# Patient Record
Sex: Female | Born: 1965
Health system: Southern US, Academic
[De-identification: ages and names within clinical notes are randomized; demographics above are authoritative.]

## PROBLEM LIST (undated history)

## (undated) ENCOUNTER — Ambulatory Visit

## (undated) ENCOUNTER — Telehealth

## (undated) ENCOUNTER — Encounter

## (undated) ENCOUNTER — Ambulatory Visit: Payer: TRICARE (CHAMPUS)

## (undated) ENCOUNTER — Encounter: Attending: Gastroenterology | Primary: Gastroenterology

## (undated) ENCOUNTER — Ambulatory Visit: Attending: Pharmacist | Primary: Pharmacist

## (undated) DIAGNOSIS — E079 Disorder of thyroid, unspecified: Secondary | ICD-10-CM

## (undated) DIAGNOSIS — Z944 Liver transplant status: Secondary | ICD-10-CM

## (undated) DIAGNOSIS — M8580 Other specified disorders of bone density and structure, unspecified site: Secondary | ICD-10-CM

## (undated) DIAGNOSIS — Z9889 Other specified postprocedural states: Secondary | ICD-10-CM

## (undated) DIAGNOSIS — T859XXA Unspecified complication of internal prosthetic device, implant and graft, initial encounter: Secondary | ICD-10-CM

## (undated) DIAGNOSIS — E8801 Alpha-1-antitrypsin deficiency: Secondary | ICD-10-CM

## (undated) DIAGNOSIS — J45909 Unspecified asthma, uncomplicated: Secondary | ICD-10-CM

## (undated) DIAGNOSIS — K219 Gastro-esophageal reflux disease without esophagitis: Secondary | ICD-10-CM

## (undated) HISTORY — DX: Unspecified complication of internal prosthetic device, implant and graft, initial encounter: T85.9XXA

## (undated) HISTORY — DX: Other specified disorders of bone density and structure, unspecified site: M85.80

## (undated) HISTORY — DX: Unspecified asthma, uncomplicated: J45.909

## (undated) HISTORY — PX: FRACTURE SURGERY: SHX138

## (undated) HISTORY — DX: Gastro-esophageal reflux disease without esophagitis: K21.9

---
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
## 2012-09-09 ENCOUNTER — Emergency Department: Payer: Self-pay | Admitting: Emergency Medicine

## 2012-09-09 LAB — URINALYSIS, COMPLETE
Bacteria: NONE SEEN
Bilirubin,UR: NEGATIVE
Ph: 5 (ref 4.5–8.0)
RBC,UR: 125 /HPF (ref 0–5)
WBC UR: 8 /HPF (ref 0–5)

## 2012-09-09 LAB — COMPREHENSIVE METABOLIC PANEL
Albumin: 2.1 g/dL — ABNORMAL LOW (ref 3.4–5.0)
Anion Gap: 8 (ref 7–16)
Bilirubin,Total: 3.5 mg/dL — ABNORMAL HIGH (ref 0.2–1.0)
Calcium, Total: 7.5 mg/dL — ABNORMAL LOW (ref 8.5–10.1)
SGOT(AST): 75 U/L — ABNORMAL HIGH (ref 15–37)
Sodium: 137 mmol/L (ref 136–145)
Total Protein: 7.2 g/dL (ref 6.4–8.2)

## 2012-09-09 LAB — CK TOTAL AND CKMB (NOT AT ARMC): CK-MB: 2.2 ng/mL (ref 0.5–3.6)

## 2012-09-09 LAB — CBC
HCT: 36 % (ref 35.0–47.0)
HGB: 11.6 g/dL — ABNORMAL LOW (ref 12.0–16.0)
MCHC: 32.3 g/dL (ref 32.0–36.0)
MCV: 103 fL — ABNORMAL HIGH (ref 80–100)
Platelet: 67 10*3/uL — ABNORMAL LOW (ref 150–440)
RBC: 3.49 10*6/uL — ABNORMAL LOW (ref 3.80–5.20)
RDW: 16.7 % — ABNORMAL HIGH (ref 11.5–14.5)
WBC: 5 10*3/uL (ref 3.6–11.0)

## 2012-09-09 LAB — TROPONIN I: Troponin-I: <0.02 ng/mL

## 2012-09-09 LAB — LIPASE, BLOOD: Lipase: 93 U/L (ref 73–393)

## 2013-07-10 ENCOUNTER — Ambulatory Visit: Payer: Self-pay | Admitting: Gastroenterology

## 2013-07-15 ENCOUNTER — Ambulatory Visit: Payer: Self-pay | Admitting: Gastroenterology

## 2013-09-15 ENCOUNTER — Ambulatory Visit: Payer: Self-pay | Admitting: Internal Medicine

## 2014-01-08 ENCOUNTER — Ambulatory Visit: Payer: Self-pay | Admitting: Gastroenterology

## 2014-01-30 HISTORY — PX: LIVER TRANSPLANT: SHX410

## 2014-01-30 HISTORY — PX: CHOLECYSTECTOMY: SHX55

## 2014-09-26 ENCOUNTER — Emergency Department (HOSPITAL_COMMUNITY)
Admission: EM | Admit: 2014-09-26 | Discharge: 2014-09-26 | Disposition: A | Discharge status: Other Acute Inpatient Hospital | Attending: Emergency Medicine | Admitting: Emergency Medicine

## 2014-09-26 ENCOUNTER — Emergency Department (HOSPITAL_COMMUNITY)

## 2014-09-26 ENCOUNTER — Encounter (HOSPITAL_COMMUNITY): Payer: Self-pay | Admitting: Emergency Medicine

## 2014-09-26 DIAGNOSIS — I959 Hypotension, unspecified: Secondary | ICD-10-CM | POA: Insufficient documentation

## 2014-09-26 DIAGNOSIS — A419 Sepsis, unspecified organism: Secondary | ICD-10-CM | POA: Insufficient documentation

## 2014-09-26 DIAGNOSIS — R1010 Upper abdominal pain, unspecified: Secondary | ICD-10-CM | POA: Diagnosis present

## 2014-09-26 DIAGNOSIS — Z8639 Personal history of other endocrine, nutritional and metabolic disease: Secondary | ICD-10-CM | POA: Diagnosis not present

## 2014-09-26 DIAGNOSIS — K830 Cholangitis: Secondary | ICD-10-CM | POA: Insufficient documentation

## 2014-09-26 DIAGNOSIS — R0602 Shortness of breath: Secondary | ICD-10-CM | POA: Diagnosis not present

## 2014-09-26 DIAGNOSIS — R000 Tachycardia, unspecified: Secondary | ICD-10-CM | POA: Diagnosis not present

## 2014-09-26 DIAGNOSIS — R6521 Severe sepsis with septic shock: Secondary | ICD-10-CM | POA: Diagnosis not present

## 2014-09-26 DIAGNOSIS — K8309 Other cholangitis: Secondary | ICD-10-CM

## 2014-09-26 HISTORY — DX: Alpha-1-antitrypsin deficiency: E88.01

## 2014-09-26 HISTORY — DX: Other specified postprocedural states: Z98.890

## 2014-09-26 HISTORY — DX: Liver transplant status: Z94.4

## 2014-09-26 HISTORY — DX: Disorder of thyroid, unspecified: E07.9

## 2014-09-26 LAB — LIPASE, BLOOD: Lipase: 18 U/L (ref 11–59)

## 2014-09-26 LAB — CBC WITH DIFFERENTIAL/PLATELET
BASOS ABS: 0 10*3/uL (ref 0.0–0.1)
Basophils Relative: 0 % (ref 0–1)
Eosinophils Absolute: 0 10*3/uL (ref 0.0–0.7)
Eosinophils Relative: 0 % (ref 0–5)
HCT: 24.9 % — ABNORMAL LOW (ref 36.0–46.0)
HEMOGLOBIN: 7.9 g/dL — AB (ref 12.0–15.0)
LYMPHS PCT: 2 % — AB (ref 12–46)
Lymphs Abs: 0.5 10*3/uL — ABNORMAL LOW (ref 0.7–4.0)
MCH: 28.1 pg (ref 26.0–34.0)
MCHC: 31.7 g/dL (ref 30.0–36.0)
MCV: 88.6 fL (ref 78.0–100.0)
Monocytes Absolute: 1.2 10*3/uL — ABNORMAL HIGH (ref 0.1–1.0)
Monocytes Relative: 5 % (ref 3–12)
Neutro Abs: 21.9 10*3/uL — ABNORMAL HIGH (ref 1.7–7.7)
Neutrophils Relative %: 93 % — ABNORMAL HIGH (ref 43–77)
Platelets: 223 10*3/uL (ref 150–400)
RBC: 2.81 MIL/uL — ABNORMAL LOW (ref 3.87–5.11)
RDW: 14.1 % (ref 11.5–15.5)
WBC Morphology: INCREASED
WBC: 23.6 10*3/uL — ABNORMAL HIGH (ref 4.0–10.5)

## 2014-09-26 LAB — COMPREHENSIVE METABOLIC PANEL
ALT: 246 U/L — ABNORMAL HIGH (ref 0–35)
AST: 225 U/L — ABNORMAL HIGH (ref 0–37)
Albumin: 2.2 g/dL — ABNORMAL LOW (ref 3.5–5.2)
Alkaline Phosphatase: 233 U/L — ABNORMAL HIGH (ref 39–117)
Anion gap: 15 (ref 5–15)
BILIRUBIN TOTAL: 10.4 mg/dL — AB (ref 0.3–1.2)
BUN: 53 mg/dL — ABNORMAL HIGH (ref 6–23)
CHLORIDE: 100 mmol/L (ref 96–112)
CO2: 18 mmol/L — ABNORMAL LOW (ref 19–32)
Calcium: 7.7 mg/dL — ABNORMAL LOW (ref 8.4–10.5)
Creatinine, Ser: 2.06 mg/dL — ABNORMAL HIGH (ref 0.50–1.10)
GFR calc non Af Amer: 27 mL/min — ABNORMAL LOW (ref >90)
GFR, EST AFRICAN AMERICAN: 31 mL/min — AB (ref >90)
GLUCOSE: 118 mg/dL — AB (ref 70–99)
Potassium: 3.1 mmol/L — ABNORMAL LOW (ref 3.5–5.1)
Sodium: 133 mmol/L — ABNORMAL LOW (ref 135–145)
TOTAL PROTEIN: 6 g/dL (ref 6.0–8.3)

## 2014-09-26 LAB — I-STAT CG4 LACTIC ACID, ED: LACTIC ACID, VENOUS: 6.26 mmol/L — AB (ref 0.5–2.0)

## 2014-09-26 LAB — BLOOD GAS, ARTERIAL
Acid-base deficit: 10.5 mmol/L — ABNORMAL HIGH (ref 0.0–2.0)
Bicarbonate: 13.9 mEq/L — ABNORMAL LOW (ref 20.0–24.0)
Drawn by: 422461
O2 Content: 4 L/min
O2 Saturation: 97 %
PATIENT TEMPERATURE: 100
PO2 ART: 95.6 mmHg (ref 80.0–100.0)
TCO2: 13.6 mmol/L (ref 0–100)
pCO2 arterial: 26.9 mmHg — ABNORMAL LOW (ref 35.0–45.0)
pH, Arterial: 7.338 — ABNORMAL LOW (ref 7.350–7.450)

## 2014-09-26 LAB — AMMONIA: Ammonia: 17 umol/L (ref 11–32)

## 2014-09-26 LAB — TROPONIN I: Troponin I: 0.05 ng/mL — ABNORMAL HIGH (ref <0.031)

## 2014-09-26 MED ORDER — NOREPINEPHRINE BITARTRATE 1 MG/ML IV SOLN
0.0000 ug/min | Freq: Once | INTRAVENOUS | Status: DC
  Filled 2014-09-26: qty 4

## 2014-09-26 MED ORDER — FENTANYL CITRATE 0.05 MG/ML IJ SOLN: 50.0000 ug | Freq: Once | INTRAMUSCULAR | Status: DC

## 2014-09-26 MED ORDER — SODIUM CHLORIDE 0.9 % IV BOLUS (SEPSIS)
1000.0000 mL | INTRAVENOUS | Status: AC
  Administered 2014-09-26 (×2): 1000 mL via INTRAVENOUS

## 2014-09-26 MED ORDER — VANCOMYCIN HCL IN DEXTROSE 1-5 GM/200ML-% IV SOLN
1000.0000 mg | Freq: Once | INTRAVENOUS | Status: DC
  Filled 2014-09-26: qty 200

## 2014-09-26 MED ORDER — SODIUM CHLORIDE 0.9 % IV BOLUS (SEPSIS)
2000.0000 mL | Freq: Once | INTRAVENOUS | Status: AC
  Administered 2014-09-26: 2000 mL via INTRAVENOUS

## 2014-09-26 MED ORDER — PIPERACILLIN-TAZOBACTAM 3.375 G IVPB
3.3750 g | Freq: Once | INTRAVENOUS | Status: AC
  Administered 2014-09-26: 3.375 g via INTRAVENOUS
  Filled 2014-09-26: qty 50

## 2014-09-26 MED ORDER — SODIUM CHLORIDE 0.9 % IV BOLUS (SEPSIS): 500.0000 mL | INTRAVENOUS | Status: DC

## 2014-09-26 NOTE — ED Notes (Addendum)
Hx of liver transplant May 2015. Fever and N/v for past few days. Hypotensive with EMS and febile. Has been taking tylenol at home. Took 650mg  tylenol at 1500.

## 2014-09-26 NOTE — ED Provider Notes (Signed)
8:25 PM At this time the patient's blood pressure is 83 systolic.  Her map is 93.  She will begin norepinephrine at this time.  She has 2 large-bore IVs and seem to be working great.  At this time I think with starting the norepinephrine I think she is stabilized to the point where she will transport the short distance without any difficulty.  I spoke with by critical care transport team who are comfortable with this.  I think it is in the patient's best interest at this time for urgent transfer to [facility] where she can be under the care of her hepatology team.  She may benefit from ERCP tonight and will deftly need additional imaging.  Heart rate seems to be improving.  Mental status continues to be good.  At this time I do not believe the patient needs to be intubated.  I've offered several line placement but at this time I think it will just delay her care and not change her outcome.  Central line can be placed when she arrives at the [facility].  Lyanne Co, MD 09/26/14 2026

## 2014-09-26 NOTE — ED Notes (Signed)
Bed: XU38 Expected date: 09/26/14 Expected time: 6:23 PM Means of arrival: Ambulance Comments: Fever

## 2014-09-26 NOTE — ED Notes (Signed)
Gave report to [facility] with [facility]. He is requesting for all images to be placed on CD for [facility]. Informed Debbie, MUS of this information.

## 2014-09-26 NOTE — ED Notes (Signed)
Attempted to call report to [facility] ED.

## 2014-09-26 NOTE — ED Provider Notes (Signed)
CSN: [id number]     Arrival date & time 09/26/14  1832 History   First MD Initiated Contact with Patient 09/26/14 1837     Chief Complaint  Patient presents with  . Hypotension  . Fever     (Consider location/radiation/quality/duration/timing/severity/associated sxs/prior Treatment) HPI Margaret Tyler is a 49 year old female status post liver transplant May/2015 at [facility] who presents the ER playing of fever, nausea, vomiting 4 days. Patient reports MAXIMUM TEMPERATURE at home 104F, and has been taking over-the-counter Tylenol. Patient reports diffuse upper abdominal pain which is also been present for the past 3 days. Patient states her symptoms have progressively worsened and today she became very weak and fell in her bathroom. Patient brought into the ER by EMS who noted patient be febrile and hypotensive. Patient denies having any injury after a fall.  Patient reports worsening of her abdominal pain with deep breaths, endorses shortness of breath. Patient denies chest pain, dizziness, weakness, blurred vision, headache, altered mental status.  Past Medical History  Diagnosis Date  . Liver transplanted   . Thyroid disease   . History of ERCP   . Alpha-1-antitrypsin deficiency    History reviewed. No pertinent past surgical history. History reviewed. No pertinent family history. History  Substance Use Topics  . Smoking status: Never Smoker   . Smokeless tobacco: Not on file  . Alcohol Use: No   OB History    No data available     Review of Systems  Constitutional: Positive for fever.  HENT: Negative for trouble swallowing.   Eyes: Negative for visual disturbance.  Respiratory: Positive for shortness of breath.   Cardiovascular: Negative for chest pain.  Gastrointestinal: Positive for nausea, vomiting, abdominal pain and diarrhea.  Genitourinary: Negative for dysuria.  Musculoskeletal: Negative for neck pain.  Skin: Negative for rash.  Neurological: Negative for  dizziness, syncope, speech difficulty, weakness, light-headedness, numbness and headaches.  Psychiatric/Behavioral: Negative.     Allergies  Fosamax and Wasp venom  Home Medications   Prior to Admission medications   Not on File   BP 85/46 mmHg  Pulse 117  Temp(Src) 97.9 F (36.6 C) (Rectal)  Resp 24  Wt 162 lb (73.483 kg)  SpO2 100%  LMP  (LMP Unknown) Physical Exam  Constitutional: She is oriented to person, place, and time. She appears well-developed and well-nourished.  Patient fully alert, with a sickly appearance, in mild to moderate distress from discomfort and shortness of breath. Patient obviously jaundiced.  HENT:  Head: Normocephalic and atraumatic.  Mouth/Throat: Oropharynx is clear and moist. No oropharyngeal exudate.  Eyes: Right eye exhibits no discharge. Left eye exhibits no discharge. No scleral icterus.  Neck: Normal range of motion.  Cardiovascular: Regular rhythm, S1 normal, S2 normal and normal heart sounds.  Tachycardia present.   No murmur heard. Pulses:      Radial pulses are 1+ on the right side, and 1+ on the left side.  Tachycardia at 120 on exam  Pulmonary/Chest: Effort normal and breath sounds normal. No accessory muscle usage. Tachypnea noted.  Abdominal: Soft. Normal appearance and bowel sounds are normal. There is generalized tenderness.  Generalized tenderness more significant in epigastric and right upper quadrant  Musculoskeletal: Normal range of motion. She exhibits no edema or tenderness.  Neurological: She is alert and oriented to person, place, and time. No cranial nerve deficit or sensory deficit. Coordination normal. GCS eye subscore is 4. GCS verbal subscore is 5. GCS motor subscore is 6.  Patient fully alert, answering questions  appropriately in full, clear sentences. Cranial nerves II through XII grossly intact. Motor strength 5 out of 5 in all major muscle groups of upper and lower extremities. Distal sensation intact.  Skin: Skin is  warm and dry. No rash noted. She is not diaphoretic.  Psychiatric: She has a normal mood and affect.  Nursing note and vitals reviewed.   ED Course  Procedures (including critical care time) Labs Review Labs Reviewed  BLOOD GAS, ARTERIAL - Abnormal; Notable for the following:    pH, Arterial 7.338 (*)    pCO2 arterial 26.9 (*)    Bicarbonate 13.9 (*)    Acid-base deficit 10.5 (*)    All other components within normal limits  CBC WITH DIFFERENTIAL/PLATELET - Abnormal; Notable for the following:    WBC 23.6 (*)    RBC 2.81 (*)    Hemoglobin 7.9 (*)    HCT 24.9 (*)    Neutrophils Relative % 93 (*)    Lymphocytes Relative 2 (*)    Neutro Abs 21.9 (*)    Lymphs Abs 0.5 (*)    Monocytes Absolute 1.2 (*)    All other components within normal limits  COMPREHENSIVE METABOLIC PANEL - Abnormal; Notable for the following:    Sodium 133 (*)    Potassium 3.1 (*)    CO2 18 (*)    Glucose, Bld 118 (*)    BUN 53 (*)    Creatinine, Ser 2.06 (*)    Calcium 7.7 (*)    Albumin 2.2 (*)    AST 225 (*)    ALT 246 (*)    Alkaline Phosphatase 233 (*)    Total Bilirubin 10.4 (*)    GFR calc non Af Amer 27 (*)    GFR calc Af Amer 31 (*)    All other components within normal limits  BRAIN NATRIURETIC PEPTIDE - Abnormal; Notable for the following:    B Natriuretic Peptide 175.9 (*)    All other components within normal limits  TROPONIN I - Abnormal; Notable for the following:    Troponin I 0.05 (*)    All other components within normal limits  I-STAT CG4 LACTIC ACID, ED - Abnormal; Notable for the following:    Lactic Acid, Venous 6.26 (*)    All other components within normal limits  CULTURE, BLOOD (ROUTINE X 2)  CULTURE, BLOOD (ROUTINE X 2)  URINE CULTURE  AMMONIA  LIPASE, BLOOD    Imaging Review Dg Chest Port 1 View  09/26/2014   CLINICAL DATA:  Acute onset of hypotension and fever. Mild shortness of breath and confusion. Nausea and vomiting. Initial encounter.  EXAM: PORTABLE CHEST  - 1 VIEW  COMPARISON:  Chest radiograph performed 09/09/2012  FINDINGS: The lungs are hypoexpanded. Mild vascular congestion is noted. Minimal right basilar opacity may reflect atelectasis or scarring. There is no evidence of pleural effusion or pneumothorax.  The cardiomediastinal silhouette is within normal limits. No acute osseous abnormalities are seen.  IMPRESSION: Minimal persistent right basilar opacity may reflect atelectasis or scarring. Lungs hypoexpanded, with mild vascular congestion.   Electronically Signed   By: Roanna Raider M.D.   On: 09/26/2014 19:26     EKG Interpretation None      MDM   Final diagnoses:  Ascending cholangitis  Septic shock   Patient here with obvious jaundice, hypotension, several days of fever status post liver transplant in May 2015. Lactic acid at 6, bicarbonate 13, Began patient on sepsis protocol, placed 2 large-bore IVs, infused 4  L of fluid during stay here. With combination of fever, jaundice, right upper quadrant abdominal pain, likely suspect likely a ascending cholangitis. Consult at with hepatology service at [facility], who agreed that patient would benefit more from [facility] based on her history and her current signs and symptoms. Patient had mild improvement of her blood pressure here into systolic of 90s after 4 L of fluid, we will begin levo fed due to patient's MAP decreasing to 58. Patient's mental status has remained A&O 4 during entire stay here. We considered placing central line here for more proficient monitoring and access for pressors, however this would more likely delay patient's care from a more appropriate facility and admission to an ICU at a tertiary care which would be unnecessary at this point. Patient could benefit from imaging, however further workup, imaging and procedures would delay patient's care at this point, we therefore believe it is more appropriate to have patient transferred to [facility] for further workup.  BP  85/46 mmHg  Pulse 117  Temp(Src) 97.9 F (36.6 C) (Rectal)  Resp 24  Wt 162 lb (73.483 kg)  SpO2 100%  LMP  (LMP Unknown)  Signed,  Ladona Mow, PA-C 1:47 AM  Patient seen and discussed with Dr. Azalia Bilis, MD   Monte Fantasia, PA-C 09/27/14 0147  Lyanne Co, MD 09/27/14 [phone]

## 2014-09-27 ENCOUNTER — Telehealth (HOSPITAL_COMMUNITY): Payer: Self-pay

## 2014-09-27 LAB — BRAIN NATRIURETIC PEPTIDE: B Natriuretic Peptide: 175.9 pg/mL — ABNORMAL HIGH (ref 0.0–100.0)

## 2014-09-27 MED FILL — Fentanyl Citrate Inj 0.05 MG/ML: INTRAMUSCULAR | Qty: 2 | Status: AC

## 2014-09-29 LAB — CULTURE, BLOOD (ROUTINE X 2)

## 2014-10-01 LAB — CULTURE, BLOOD (ROUTINE X 2)

## 2014-10-05 ENCOUNTER — Telehealth (HOSPITAL_BASED_OUTPATIENT_CLINIC_OR_DEPARTMENT_OTHER): Payer: Self-pay | Admitting: Emergency Medicine

## 2014-10-05 NOTE — Telephone Encounter (Signed)
Post ED Visit - Positive Culture Follow-up  Culture report reviewed by antimicrobial stewardship pharmacist:  Wes Dulaney, Pharm.D., BCPS  Celedonio Miyamoto, [address].D., BCPS  Georgina Pillion, Pharm.D., BCPS  [place], [place].D., BCPS, AAHIVP  Estella Husk, Pharm.D., BCPS, AAHIVP  Elder Cyphers, [address].D., BCPS   Positive blood culture Clostridium  Treated with none, patient transferred to [facility], [facility] @ [phone]  Berle Mull 10/05/2014, 10:26 AM

## 2015-02-17 ENCOUNTER — Telehealth: Payer: Self-pay | Admitting: Family Medicine

## 2015-02-17 MED ORDER — LEVOTHYROXINE SODIUM 125 MCG PO TABS: 125.0000 ug | ORAL_TABLET | Freq: Every day | ORAL | Status: DC

## 2015-02-17 NOTE — Telephone Encounter (Signed)
PT SAID THAT SHE IS OUT OF HER THYROID MEDS AND HAS MADE AN APPT FOR June 29. COULD HE GIVE HER ENOUGH TO MAKE IT THRU TILL THEN. PHARM IS [facility] ON [address]

## 2015-02-18 NOTE — Telephone Encounter (Signed)
LFT PT MESSAGE ABOUT MEDICATION.

## 2015-03-02 ENCOUNTER — Telehealth: Payer: Self-pay | Admitting: Family Medicine

## 2015-03-02 ENCOUNTER — Ambulatory Visit: Payer: Self-pay | Admitting: Family Medicine

## 2015-03-02 DIAGNOSIS — E038 Other specified hypothyroidism: Secondary | ICD-10-CM

## 2015-03-02 MED ORDER — LEVOTHYROXINE SODIUM 125 MCG PO TABS: 125.0000 ug | ORAL_TABLET | Freq: Every day | ORAL | Status: DC

## 2015-03-02 NOTE — Telephone Encounter (Signed)
Pt's [facility] liver coordinator called wanting to know if patient could get a refill on her thyroid medication.  She is in the process of finding another PCP due to her [employer] not being filed here at [facility].

## 2015-03-02 NOTE — Telephone Encounter (Signed)
Pt came in for appt scheduled today. 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 She was informed that we are unable to file [employer] by itself. Pt became very upset and was adamant that she has to see an MD down here. Pt said that she HAD to speak to her MD, Dr. Sherryll Burger, and stated that she wanted him to call her and walked out.

## 2015-06-08 ENCOUNTER — Encounter: Payer: Self-pay | Admitting: Family Medicine

## 2015-06-08 DIAGNOSIS — K219 Gastro-esophageal reflux disease without esophagitis: Secondary | ICD-10-CM | POA: Insufficient documentation

## 2015-06-08 DIAGNOSIS — E8801 Alpha-1-antitrypsin deficiency: Secondary | ICD-10-CM | POA: Insufficient documentation

## 2015-06-08 DIAGNOSIS — Z944 Liver transplant status: Secondary | ICD-10-CM

## 2015-06-09 ENCOUNTER — Other Ambulatory Visit: Payer: Self-pay | Admitting: Family Medicine

## 2015-06-09 ENCOUNTER — Ambulatory Visit (INDEPENDENT_AMBULATORY_CARE_PROVIDER_SITE_OTHER): Payer: TRICARE For Life (TFL) | Admitting: Family Medicine

## 2015-06-09 ENCOUNTER — Encounter: Payer: Self-pay | Admitting: Family Medicine

## 2015-06-09 VITALS — BP 118/80 | HR 85 | Temp 98.1°F | Resp 22 | Ht 64.0 in | Wt 170.0 lb

## 2015-06-09 DIAGNOSIS — E8801 Alpha-1-antitrypsin deficiency: Secondary | ICD-10-CM | POA: Diagnosis not present

## 2015-06-09 DIAGNOSIS — T859XXA Unspecified complication of internal prosthetic device, implant and graft, initial encounter: Secondary | ICD-10-CM | POA: Insufficient documentation

## 2015-06-09 DIAGNOSIS — Z1239 Encounter for other screening for malignant neoplasm of breast: Secondary | ICD-10-CM

## 2015-06-09 DIAGNOSIS — Z9189 Other specified personal risk factors, not elsewhere classified: Secondary | ICD-10-CM | POA: Diagnosis not present

## 2015-06-09 DIAGNOSIS — J4520 Mild intermittent asthma, uncomplicated: Secondary | ICD-10-CM

## 2015-06-09 DIAGNOSIS — Z23 Encounter for immunization: Secondary | ICD-10-CM | POA: Diagnosis not present

## 2015-06-09 DIAGNOSIS — Z7189 Other specified counseling: Secondary | ICD-10-CM

## 2015-06-09 DIAGNOSIS — E038 Other specified hypothyroidism: Secondary | ICD-10-CM | POA: Diagnosis not present

## 2015-06-09 DIAGNOSIS — Z944 Liver transplant status: Secondary | ICD-10-CM | POA: Diagnosis not present

## 2015-06-09 DIAGNOSIS — Z7689 Persons encountering health services in other specified circumstances: Secondary | ICD-10-CM

## 2015-06-09 LAB — CBC WITH DIFFERENTIAL/PLATELET
BASOS PCT: 0 % (ref 0–1)
Basophils Absolute: 0 10*3/uL (ref 0.0–0.1)
Eosinophils Absolute: 0.1 10*3/uL (ref 0.0–0.7)
Eosinophils Relative: 1 % (ref 0–5)
HCT: 47.5 % — ABNORMAL HIGH (ref 36.0–46.0)
Hemoglobin: 15.3 g/dL — ABNORMAL HIGH (ref 12.0–15.0)
Lymphocytes Relative: 10 % — ABNORMAL LOW (ref 12–46)
Lymphs Abs: 1.1 10*3/uL (ref 0.7–4.0)
MCH: 30.2 pg (ref 26.0–34.0)
MCHC: 32.2 g/dL (ref 30.0–36.0)
MCV: 93.7 fL (ref 78.0–100.0)
MPV: 12.7 fL — ABNORMAL HIGH (ref 8.6–12.4)
Monocytes Absolute: 0.6 10*3/uL (ref 0.1–1.0)
Monocytes Relative: 6 % (ref 3–12)
NEUTROS PCT: 83 % — AB (ref 43–77)
Neutro Abs: 8.8 10*3/uL — ABNORMAL HIGH (ref 1.7–7.7)
Platelets: 124 10*3/uL — ABNORMAL LOW (ref 150–400)
RBC: 5.07 MIL/uL (ref 3.87–5.11)
RDW: 14.1 % (ref 11.5–15.5)
WBC: 10.6 10*3/uL — AB (ref 4.0–10.5)

## 2015-06-09 LAB — COMPLETE METABOLIC PANEL WITH GFR
ALT: 37 U/L — ABNORMAL HIGH (ref 6–29)
AST: 17 U/L (ref 10–35)
Albumin: 3.7 g/dL (ref 3.6–5.1)
Alkaline Phosphatase: 168 U/L — ABNORMAL HIGH (ref 33–130)
BILIRUBIN TOTAL: 0.4 mg/dL (ref 0.2–1.2)
BUN: 32 mg/dL — ABNORMAL HIGH (ref 7–25)
CO2: 25 mmol/L (ref 20–31)
Calcium: 8.7 mg/dL (ref 8.6–10.4)
Chloride: 102 mmol/L (ref 98–110)
Creat: 0.95 mg/dL (ref 0.50–1.05)
GFR, Est African American: 81 mL/min (ref >=60)
GFR, Est Non African American: 70 mL/min (ref >=60)
GLUCOSE: 158 mg/dL — AB (ref 70–99)
POTASSIUM: 3.8 mmol/L (ref 3.5–5.3)
SODIUM: 139 mmol/L (ref 135–146)
TOTAL PROTEIN: 6.5 g/dL (ref 6.1–8.1)

## 2015-06-09 LAB — TSH: TSH: 1.109 u[IU]/mL (ref 0.350–4.500)

## 2015-06-09 MED ORDER — LEVOTHYROXINE SODIUM 125 MCG PO TABS: 125.0000 ug | ORAL_TABLET | Freq: Every day | ORAL | Status: DC

## 2015-06-09 NOTE — Progress Notes (Signed)
Subjective:    Patient ID: Margaret Tyler, female    DOB: 28-Dec-1965, 49 y.o.   MRN: [id number]  HPI Patient is a 49 year old white female here today to establish care. Past medical history is significant for alpha-1 antitrypsin deficiency. Patient received a liver transplant in 2015. She also has a history of asthma for which she uses Advair sporadically which is likely related to her alpha-1 antitrypsin deficiency. She also has a history of what sounds like cholangitis or common bile duct stenosis. This is been treated with stenting through ERCP and is monitored at [facility]. She also has a remote history of hypothyroidism as well as gastroesophageal reflux disease and insomnia. She does have chronic pain related to her transplant and uses gabapentin and tramadol sparingly for pain. She uses trazodone to help her sleep at night. She is overdue for mammogram. She has never had a colonoscopy. She is due for a flu shot. She is unsure of whether or not she has had Pneumovax 23. Last Pap smear was performed in 2015 and is not due again until 2018. Past Medical History  Diagnosis Date  . Liver transplanted (HCC)   . Thyroid disease   . History of ERCP   . Alpha-1-antitrypsin deficiency (HCC)   . GERD (gastroesophageal reflux disease)   . Disorder of bile duct stent   . Asthma     alpha antitrypsin def   Past Surgical History  Procedure Laterality Date  . Fracture surgery  2004/2005    left hip  . Cholecystectomy  01/30/14  . Liver transplant  01/30/14    due to Alpha 1  . Elbow surgery  1974    shattered after accident   Current Outpatient Prescriptions on File Prior to Visit  Medication Sig Dispense Refill  . aspirin 325 MG EC tablet Take 325 mg by mouth daily.    . Ergocalciferol (VITAMIN D2) 2000 UNITS TABS Take by mouth.    . Ferrous Fumarate 324 (106 FE) MG TABS Take by mouth.    . Fluticasone-Salmeterol (ADVAIR DISKUS) 100-50 MCG/DOSE AEPB Inhale into the lungs.    . gabapentin  (NEURONTIN) 100 MG capsule Take by mouth at bedtime.    . hydrOXYzine (ATARAX/VISTARIL) 25 MG tablet Take 1 tablet by mouth 2 (two) times daily.     . magnesium oxide (MAG-OX) 400 MG tablet Take 3 tablets by mouth 3 (three) times daily. 9AM-5PM-9PM    . Multiple Vitamins-Calcium (ONE-A-DAY WOMENS FORMULA) TABS Take 1 tablet by mouth daily.    . Omega-3 Fatty Acids (FISH OIL) 1200 MG CAPS Take 1 capsule by mouth daily.    . pantoprazole (PROTONIX) 40 MG tablet Take 1 tablet by mouth 2 (two) times daily with a meal.    . SUMAtriptan (IMITREX) 25 MG tablet Take by mouth.    . tacrolimus (PROGRAF) 5 MG capsule 1 tab po BID    . traMADol (ULTRAM) 50 MG tablet Take by mouth.    . traZODone (DESYREL) 50 MG tablet Take by mouth.     No current facility-administered medications on file prior to visit.   Allergies  Allergen Reactions  . Fosamax [Alendronate]   . Furosemide Other (See Comments)    Abdominal pain, fever.  Lonna Duval Venom    Social History   Social History  . Marital Status: Married    Spouse Name: N/A  . Number of Children: N/A  . Years of Education: N/A   Occupational History  . Not on file.  Social History Main Topics  . Smoking status: Former Smoker    Quit date: 06/07/2012  . Smokeless tobacco: Not on file  . Alcohol Use: No  . Drug Use: No  . Sexual Activity: Yes   Other Topics Concern  . Not on file   Social History Narrative   Family History  Problem Relation Age of Onset  . Cancer Mother     breast/uterus  . Cancer Father     prostate      Review of Systems  All other systems reviewed and are negative.      Objective:   Physical Exam  Constitutional: She is oriented to person, place, and time. She appears well-developed and well-nourished. No distress.  HENT:  Head: Normocephalic and atraumatic.  Right Ear: External ear normal.  Left Ear: External ear normal.  Nose: Nose normal.  Mouth/Throat: Oropharynx is clear and moist. No  oropharyngeal exudate.  Eyes: Conjunctivae and EOM are normal. Pupils are equal, round, and reactive to light. Right eye exhibits no discharge. Left eye exhibits no discharge. No scleral icterus.  Neck: Normal range of motion. Neck supple. No JVD present. No tracheal deviation present. No thyromegaly present.  Cardiovascular: Normal rate, regular rhythm, normal heart sounds and intact distal pulses.  Exam reveals no gallop and no friction rub.   No murmur heard. Pulmonary/Chest: Effort normal and breath sounds normal. No stridor. No respiratory distress. She has no wheezes. She has no rales. She exhibits no tenderness.  Abdominal: Soft. Bowel sounds are normal. She exhibits no distension and no mass. There is no tenderness. There is no rebound and no guarding.  Musculoskeletal: Normal range of motion. She exhibits no edema or tenderness.  Lymphadenopathy:    She has no cervical adenopathy.  Neurological: She is alert and oriented to person, place, and time. She has normal reflexes. She displays normal reflexes. No cranial nerve deficit. She exhibits normal muscle tone. Coordination normal.  Skin: Skin is warm. No rash noted. She is not diaphoretic. No erythema. No pallor.  Psychiatric: She has a normal mood and affect. Her behavior is normal. Judgment and thought content normal.  Vitals reviewed.         Assessment & Plan:  Other specified hypothyroidism - Plan: CBC with Differential/Platelet, COMPLETE METABOLIC PANEL WITH GFR, TSH, levothyroxine (SYNTHROID, LEVOTHROID) 125 MCG tablet  Liver transplant recipient [facility])  Alpha-1-antitrypsin deficiency (HCC)  Asthma, mild intermittent, uncomplicated  Establishing care with new doctor, encounter for  Screening for breast cancer - Plan: MM Digital Screening  At high risk for osteoporosis - Plan: DG Bone Density  Patient is due to recheck a TSH. Meanwhile I'll refill the patient's levothyroxin. Patient's liver function tests were  monitored at [facility] on a monthly basis. At the present time her asthma is well controlled. However I will give her the flu shot given the fact she is immunocompromised. Also recommended Pneumovax 23 but I asked the patient to check with her liver transplant team prior to receiving this as I imagine she has already had this. She is due for colonoscopy but she would like to discuss this with her liver transplant team prior to scheduling it. I will schedule the patient for mammogram as this is due. She is not due for a Pap smear until 2018. I will also schedule the patient for a bone density as she is taking thyroid medication, prednisone, chronic immunosuppressives, and gabapentin WHICH put her at higher risk for osteoporosis.. [email or web address] [id number]

## 2015-06-09 NOTE — Addendum Note (Signed)
Addended by: Legrand Rams B on: 06/09/2015 09:51 AM   Modules accepted: Orders

## 2015-06-10 LAB — HEMOGLOBIN A1C
HEMOGLOBIN A1C: 6.2 % — AB (ref <5.7)
Mean Plasma Glucose: 131 mg/dL — ABNORMAL HIGH (ref <117)

## 2015-07-07 ENCOUNTER — Telehealth: Payer: Self-pay | Admitting: *Deleted

## 2015-07-07 NOTE — Telephone Encounter (Signed)
Pt has appointment scheduled on Wed Nov 16 at 8:40am to have a Bone Density and Mammogram at [facility] in [place] at [facility] [address] [facility] on pt vm for appt information

## 2015-07-08 NOTE — Telephone Encounter (Signed)
lmtr[facility]

## 2015-07-11 NOTE — Telephone Encounter (Signed)
Pt aware of appt.

## 2015-07-20 ENCOUNTER — Other Ambulatory Visit

## 2015-07-20 ENCOUNTER — Ambulatory Visit

## 2015-08-03 ENCOUNTER — Encounter

## 2015-08-03 ENCOUNTER — Ambulatory Visit: Attending: Family Medicine

## 2015-10-10 ENCOUNTER — Ambulatory Visit
Admission: RE | Admit: 2015-10-10 | Discharge: 2015-10-10 | Disposition: A | Discharge status: Home / Self Care | Source: Ambulatory Visit | Attending: Family Medicine | Admitting: Family Medicine

## 2015-10-10 DIAGNOSIS — M8580 Other specified disorders of bone density and structure, unspecified site: Secondary | ICD-10-CM | POA: Insufficient documentation

## 2015-10-10 DIAGNOSIS — Z1231 Encounter for screening mammogram for malignant neoplasm of breast: Secondary | ICD-10-CM | POA: Diagnosis present

## 2015-10-10 DIAGNOSIS — Z9189 Other specified personal risk factors, not elsewhere classified: Secondary | ICD-10-CM

## 2015-10-10 DIAGNOSIS — Z13820 Encounter for screening for osteoporosis: Secondary | ICD-10-CM | POA: Insufficient documentation

## 2015-10-10 DIAGNOSIS — Z1239 Encounter for other screening for malignant neoplasm of breast: Secondary | ICD-10-CM

## 2015-10-11 ENCOUNTER — Encounter: Payer: Self-pay | Admitting: Family Medicine

## 2016-01-03 ENCOUNTER — Ambulatory Visit (INDEPENDENT_AMBULATORY_CARE_PROVIDER_SITE_OTHER): Payer: TRICARE For Life (TFL) | Admitting: Family Medicine

## 2016-01-03 ENCOUNTER — Encounter: Payer: Self-pay | Admitting: Family Medicine

## 2016-01-03 VITALS — BP 130/72 | HR 78 | Temp 98.0°F | Resp 18 | Ht 64.0 in | Wt 179.0 lb

## 2016-01-03 DIAGNOSIS — R7303 Prediabetes: Secondary | ICD-10-CM | POA: Diagnosis not present

## 2016-01-03 DIAGNOSIS — R5383 Other fatigue: Secondary | ICD-10-CM

## 2016-01-03 LAB — COMPLETE METABOLIC PANEL WITH GFR
ALBUMIN: 4.2 g/dL (ref 3.6–5.1)
ALT: 25 U/L (ref 6–29)
AST: 17 U/L (ref 10–35)
Alkaline Phosphatase: 156 U/L — ABNORMAL HIGH (ref 33–130)
BUN: 23 mg/dL (ref 7–25)
CHLORIDE: 106 mmol/L (ref 98–110)
CO2: 18 mmol/L — AB (ref 20–31)
Calcium: 8.8 mg/dL (ref 8.6–10.4)
Creat: 0.89 mg/dL (ref 0.50–1.05)
GFR, EST AFRICAN AMERICAN: 87 mL/min (ref >=60)
GFR, EST NON AFRICAN AMERICAN: 75 mL/min (ref >=60)
GLUCOSE: 84 mg/dL (ref 70–99)
Potassium: 4.2 mmol/L (ref 3.5–5.3)
SODIUM: 140 mmol/L (ref 135–146)
TOTAL PROTEIN: 6.8 g/dL (ref 6.1–8.1)
Total Bilirubin: 0.5 mg/dL (ref 0.2–1.2)

## 2016-01-03 LAB — CBC WITH DIFFERENTIAL/PLATELET
BASOS ABS: 0 {cells}/uL (ref 0–200)
BASOS PCT: 0 %
EOS ABS: 142 {cells}/uL (ref 15–500)
EOS PCT: 2 %
HCT: 47.4 % — ABNORMAL HIGH (ref 35.0–45.0)
HEMOGLOBIN: 14.9 g/dL (ref 12.0–15.0)
LYMPHS ABS: 1278 {cells}/uL (ref 850–3900)
Lymphocytes Relative: 18 %
MCH: 28.1 pg (ref 27.0–33.0)
MCHC: 31.4 g/dL — ABNORMAL LOW (ref 32.0–36.0)
MCV: 89.3 fL (ref 80.0–100.0)
MPV: 12.2 fL (ref 7.5–12.5)
Monocytes Absolute: 497 cells/uL (ref 200–950)
Monocytes Relative: 7 %
NEUTROS ABS: 5183 {cells}/uL (ref 1500–7800)
Neutrophils Relative %: 73 %
Platelets: 149 10*3/uL (ref 140–400)
RBC: 5.31 MIL/uL — ABNORMAL HIGH (ref 3.80–5.10)
RDW: 14.9 % (ref 11.0–15.0)
WBC: 7.1 10*3/uL (ref 3.8–10.8)

## 2016-01-03 LAB — VITAMIN B12: VITAMIN B 12: 854 pg/mL (ref 200–1100)

## 2016-01-03 LAB — CORTISOL: CORTISOL PLASMA: 11.3 ug/dL

## 2016-01-03 LAB — HEMOGLOBIN A1C
HEMOGLOBIN A1C: 5.5 % (ref <5.7)
MEAN PLASMA GLUCOSE: 111 mg/dL

## 2016-01-03 LAB — TSH: TSH: 2.23 mIU/L

## 2016-01-03 NOTE — Progress Notes (Signed)
Subjective:    Patient ID: Margaret Tyler, female    DOB: 1966/02/09, 50 y.o.   MRN: [id number]  HPI 10/16 Patient is a 50 year old white female here today to establish care. Past medical history is significant for alpha-1 antitrypsin deficiency. Patient received a liver transplant in 2015. She also has a history of asthma for which she uses Advair sporadically which is likely related to her alpha-1 antitrypsin deficiency. She also has a history of what sounds like cholangitis or common bile duct stenosis. This has been treated with stenting through ERCP and is monitored at [facility]. She also has a remote history of hypothyroidism as well as gastroesophageal reflux disease and insomnia. She does have chronic pain related to her transplant and uses gabapentin and tramadol sparingly for pain. She uses trazodone to help her sleep at night. She is overdue for mammogram. She has never had a colonoscopy. She is due for a flu shot. She is unsure of whether or not she has had Pneumovax 23. Last Pap smear was performed in 2015 and is not due again until 2018.  At that time, my plan was: Patient is due to recheck a TSH. Meanwhile I'll refill the patient's levothyroxin. Patient's liver function tests were monitored at [facility] on a monthly basis. At the present time her asthma is well controlled. However I will give her the flu shot given the fact she is immunocompromised. Also recommended Pneumovax 23 but I asked the patient to check with her liver transplant team prior to receiving this as I imagine she has already had this. She is due for colonoscopy but she would like to discuss this with her liver transplant team prior to scheduling it. I will schedule the patient for mammogram as this is due. She is not due for a Pap smear until 2018. I will also schedule the patient for a bone density as she is taking thyroid medication, prednisone, chronic immunosuppressives, and gabapentin WHICH put her at higher risk for  osteoporosis..   01/03/16 Patient presents complaining of 4 weeks of severe fatigue. She denies any specific trigger although recently she did wean off prednisone which she's been taking for 2 years due to her liver transplant. However she was weaned off the prednisone gradually. Other than that she's been doing well. She denies any fevers or chills. She denies any nausea vomiting or diarrhea. She denies any chest pain shortness of breath dyspnea on exertion or cough. She denies any dysuria or hematuria or frequency. She does have some mild chronic abdominal pain but this is unchanged. She denies any jaundice or scleral icterus. She denies any itching in the skin. She denies any bruising or easy bleeding. She denies any weight loss or night sweats. Physical exam is unremarkable Past Medical History  Diagnosis Date  . Liver transplanted (HCC)   . Thyroid disease   . History of ERCP   . Alpha-1-antitrypsin deficiency (HCC)   . GERD (gastroesophageal reflux disease)   . Disorder of bile duct stent   . Asthma     alpha antitrypsin def  . Osteopenia    Past Surgical History  Procedure Laterality Date  . Fracture surgery  2004/2005    left hip  . Cholecystectomy  01/30/14  . Liver transplant  01/30/14    due to Alpha 1  . Elbow surgery  1974    shattered after accident   Current Outpatient Prescriptions on File Prior to Visit  Medication Sig Dispense Refill  . aspirin 325 MG  EC tablet Take 325 mg by mouth daily.    . Ergocalciferol (VITAMIN D2) 2000 UNITS TABS Take by mouth.    . Ferrous Fumarate 324 (106 FE) MG TABS Take by mouth.    . Fluticasone-Salmeterol (ADVAIR DISKUS) 100-50 MCG/DOSE AEPB Inhale into the lungs.    . gabapentin (NEURONTIN) 100 MG capsule Take by mouth at bedtime.    . hydrOXYzine (ATARAX/VISTARIL) 25 MG tablet Take 1 tablet by mouth 2 (two) times daily.     Marland Kitchen levothyroxine (SYNTHROID, LEVOTHROID) 125 MCG tablet Take 1 tablet (125 mcg total) by mouth daily before  breakfast. 30 tablet 5  . magnesium oxide (MAG-OX) 400 MG tablet Take 2 tablets by mouth 3 (three) times daily. 9AM-5PM-9PM    . Multiple Vitamins-Calcium (ONE-A-DAY WOMENS FORMULA) TABS Take 1 tablet by mouth daily.    . mycophenolate (MYFORTIC) 180 MG EC tablet Take 720 mg by mouth 2 (two) times daily.    . Omega-3 Fatty Acids (FISH OIL) 1200 MG CAPS Take 1 capsule by mouth daily.    . pantoprazole (PROTONIX) 40 MG tablet Take 1 tablet by mouth 2 (two) times daily with a meal.    . tacrolimus (PROGRAF) 5 MG capsule 1 tab po BID    . traMADol (ULTRAM) 50 MG tablet Take by mouth.    . traZODone (DESYREL) 50 MG tablet Take 25 mg by mouth at bedtime.      No current facility-administered medications on file prior to visit.   Allergies  Allergen Reactions  . Fosamax [Alendronate]   . Furosemide Other (See Comments)    Abdominal pain, fever.  Lonna Duval Venom    Social History   Social History  . Marital Status: Married    Spouse Name: N/A  . Number of Children: N/A  . Years of Education: N/A   Occupational History  . Not on file.   Social History Main Topics  . Smoking status: Former Smoker    Quit date: 06/07/2012  . Smokeless tobacco: Not on file  . Alcohol Use: No  . Drug Use: No  . Sexual Activity: Yes   Other Topics Concern  . Not on file   Social History Narrative   Family History  Problem Relation Age of Onset  . Cancer Mother     breast/uterus  . Breast cancer Mother   . Cancer Father     prostate      Review of Systems  All other systems reviewed and are negative.      Objective:   Physical Exam  Constitutional: She is oriented to person, place, and time. She appears well-developed and well-nourished. No distress.  HENT:  Head: Normocephalic and atraumatic.  Right Ear: External ear normal.  Left Ear: External ear normal.  Nose: Nose normal.  Mouth/Throat: Oropharynx is clear and moist. No oropharyngeal exudate.  Eyes: Conjunctivae and EOM are  normal. Pupils are equal, round, and reactive to light. Right eye exhibits no discharge. Left eye exhibits no discharge. No scleral icterus.  Neck: Normal range of motion. Neck supple. No JVD present. No tracheal deviation present. No thyromegaly present.  Cardiovascular: Normal rate, regular rhythm, normal heart sounds and intact distal pulses.  Exam reveals no gallop and no friction rub.   No murmur heard. Pulmonary/Chest: Effort normal and breath sounds normal. No stridor. No respiratory distress. She has no wheezes. She has no rales. She exhibits no tenderness.  Abdominal: Soft. Bowel sounds are normal. She exhibits no distension and no mass. There  is no tenderness. There is no rebound and no guarding.  Musculoskeletal: Normal range of motion. She exhibits no edema or tenderness.  Lymphadenopathy:    She has no cervical adenopathy.  Neurological: She is alert and oriented to person, place, and time. She has normal reflexes. No cranial nerve deficit. She exhibits normal muscle tone. Coordination normal.  Skin: Skin is warm. No rash noted. She is not diaphoretic. No erythema. No pallor.  Psychiatric: She has a normal mood and affect. Her behavior is normal. Judgment and thought content normal.  Vitals reviewed.         Assessment & Plan:  Other fatigue - Plan: CBC with Differential/Platelet, COMPLETE METABOLIC PANEL WITH GFR, TSH, Vitamin B12, Cortisol  Prediabetes - Plan: Hemoglobin A1c  Exam today and is unremarkable. I will check a CBC to monitor for anemia and leukemia and bone marrow problems. I will check a CMP to evaluate for renal dysfunction for worsening hepatic function. I will check a TSH to monitor for worsening or under treatment of her hypothyroidism. I will check a vitamin B12 level. Given her recent discontinuation of prednisone I will check a cortisol level to evaluate for adrenal insufficiency. I will also check a hemoglobin A1c to see if her prediabetes is worsened.  Further workup depending upon the results of lab tests.

## 2016-01-10 ENCOUNTER — Encounter: Payer: Self-pay | Admitting: *Deleted

## 2016-01-24 ENCOUNTER — Other Ambulatory Visit: Payer: Self-pay | Admitting: Family Medicine

## 2016-01-24 DIAGNOSIS — E038 Other specified hypothyroidism: Secondary | ICD-10-CM

## 2016-01-24 MED ORDER — LEVOTHYROXINE SODIUM 125 MCG PO TABS: 125.0000 ug | ORAL_TABLET | Freq: Every day | ORAL | Status: DC

## 2016-04-26 ENCOUNTER — Ambulatory Visit: Payer: TRICARE For Life (TFL) | Admitting: Family Medicine

## 2016-04-26 ENCOUNTER — Encounter: Payer: Self-pay | Admitting: Family Medicine

## 2016-04-26 ENCOUNTER — Ambulatory Visit (INDEPENDENT_AMBULATORY_CARE_PROVIDER_SITE_OTHER): Payer: TRICARE For Life (TFL) | Admitting: Family Medicine

## 2016-04-26 VITALS — BP 128/86 | HR 64 | Temp 98.0°F | Resp 16 | Wt 176.0 lb

## 2016-04-26 DIAGNOSIS — R7303 Prediabetes: Secondary | ICD-10-CM | POA: Diagnosis not present

## 2016-04-26 DIAGNOSIS — D509 Iron deficiency anemia, unspecified: Secondary | ICD-10-CM

## 2016-04-26 DIAGNOSIS — E038 Other specified hypothyroidism: Secondary | ICD-10-CM | POA: Diagnosis not present

## 2016-04-26 LAB — CBC WITH DIFFERENTIAL/PLATELET
BASOS ABS: 58 {cells}/uL (ref 0–200)
Basophils Relative: 1 %
EOS PCT: 4 %
Eosinophils Absolute: 232 cells/uL (ref 15–500)
HCT: 43.4 % (ref 35.0–45.0)
Hemoglobin: 13.6 g/dL (ref 12.0–15.0)
LYMPHS PCT: 22 %
Lymphs Abs: 1276 cells/uL (ref 850–3900)
MCH: 27.4 pg (ref 27.0–33.0)
MCHC: 31.3 g/dL — AB (ref 32.0–36.0)
MCV: 87.5 fL (ref 80.0–100.0)
MONOS PCT: 7 %
MPV: 12.7 fL — ABNORMAL HIGH (ref 7.5–12.5)
Monocytes Absolute: 406 cells/uL (ref 200–950)
NEUTROS ABS: 3828 {cells}/uL (ref 1500–7800)
Neutrophils Relative %: 66 %
PLATELETS: 168 10*3/uL (ref 140–400)
RBC: 4.96 MIL/uL (ref 3.80–5.10)
RDW: 14.1 % (ref 11.0–15.0)
WBC: 5.8 10*3/uL (ref 3.8–10.8)

## 2016-04-26 LAB — COMPLETE METABOLIC PANEL WITH GFR
ALT: 43 U/L — ABNORMAL HIGH (ref 6–29)
AST: 26 U/L (ref 10–35)
Albumin: 3.9 g/dL (ref 3.6–5.1)
Alkaline Phosphatase: 158 U/L — ABNORMAL HIGH (ref 33–130)
BUN: 20 mg/dL (ref 7–25)
CHLORIDE: 106 mmol/L (ref 98–110)
CO2: 24 mmol/L (ref 20–31)
Calcium: 9.3 mg/dL (ref 8.6–10.4)
Creat: 0.99 mg/dL (ref 0.50–1.05)
GFR, EST NON AFRICAN AMERICAN: 66 mL/min (ref >=60)
GFR, Est African American: 76 mL/min (ref >=60)
GLUCOSE: 77 mg/dL (ref 70–99)
POTASSIUM: 4.6 mmol/L (ref 3.5–5.3)
Sodium: 142 mmol/L (ref 135–146)
Total Bilirubin: 0.5 mg/dL (ref 0.2–1.2)
Total Protein: 6.6 g/dL (ref 6.1–8.1)

## 2016-04-26 LAB — IRON: Iron: 43 ug/dL — ABNORMAL LOW (ref 45–160)

## 2016-04-26 LAB — LIPID PANEL
Cholesterol: 168 mg/dL (ref 125–200)
HDL: 50 mg/dL (ref >=46)
LDL CALC: 102 mg/dL (ref <130)
TRIGLYCERIDES: 82 mg/dL (ref <150)
Total CHOL/HDL Ratio: 3.4 Ratio (ref <=5.0)
VLDL: 16 mg/dL (ref <30)

## 2016-04-26 LAB — MAGNESIUM: Magnesium: 1.5 mg/dL (ref 1.5–2.5)

## 2016-04-26 LAB — TSH: TSH: 0.98 mIU/L

## 2016-04-26 NOTE — Progress Notes (Signed)
Subjective:    Patient ID: Margaret CorkChristine Chesler, female    DOB: [birth year], 50 y.o.   MRN: [id number]  HPI 10/16 Patient is a 50 year old white female here today to establish care. Past medical history is significant for alpha-1 antitrypsin deficiency. Patient received a liver transplant in 2015. She also has a history of asthma for which she uses Advair sporadically which is likely related to her alpha-1 antitrypsin deficiency. She also has a history of what sounds like cholangitis or common bile duct stenosis. This has been treated with stenting through ERCP and [facility]. She also has a remote history of hypothyroidism as well as gastroesophageal reflux disease and insomnia. She does have chronic pain related to her transplant and uses gabapentin and tramadol sparingly for pain. She uses trazodone to help her sleep at night. She is overdue for mammogram. She has never had a colonoscopy. She is due for a flu shot. She is unsure of whether or not she has had Pneumovax 23. Last Pap smear was performed in 2015 and is not due again until 2018.  At that time, my plan was: Patient is due to recheck a TSH. Meanwhile I'll refill the patient's levothyroxin. [facility]. At the present time her asthma is well controlled. However I will give her the flu shot given the fact she is immunocompromised. Also recommended Pneumovax 23 but I asked the patient to check with her liver transplant team prior to receiving this as I imagine she has already had this. She is due for colonoscopy but she would like to discuss this with her liver transplant team prior to scheduling it. I will schedule the patient for mammogram as this is due. She is not due for a Pap smear until 2018. I will also schedule the patient for a bone density as she is taking thyroid medication, prednisone, chronic immunosuppressives, and gabapentin WHICH put her at higher risk for  osteoporosis..   01/03/16 Patient presents complaining of 4 weeks of severe fatigue. She denies any specific trigger although recently she did wean off prednisone which she's been taking for 2 years due to her liver transplant. However she was weaned off the prednisone gradually. Other than that she's been doing well. She denies any fevers or chills. She denies any nausea vomiting or diarrhea. She denies any chest pain shortness of breath dyspnea on exertion or cough. She denies any dysuria or hematuria or frequency. She does have some mild chronic abdominal pain but this is unchanged. She denies any jaundice or scleral icterus. She denies any itching in the skin. She denies any bruising or easy bleeding. She denies any weight loss or night sweats. Physical exam is unremarkable.  At that time, my plan was: Exam today and is unremarkable. I will check a CBC to monitor for anemia and leukemia and bone marrow problems. I will check a CMP to evaluate for renal dysfunction for worsening hepatic function. I will check a TSH to monitor for worsening or under treatment of her hypothyroidism. I will check a vitamin B12 level. Given her recent discontinuation of prednisone I will check a cortisol level to evaluate for adrenal insufficiency. I will also check a hemoglobin A1c to see if her prediabetes is worsened. Further workup depending upon the results of lab tests.  04/26/16 Patient is here today for follow-up. For whatever reason, the last lab work that I have in the current system is from October.  She is overdue for lab work to monitor her TSH. She has a history of hypothyroidism for which she takes levothyroxin. She is also requesting her magnesium level be checked. She takes 400 mg a day of magnesium oxide. Her transplant specialist at [facility] has recommended that she take 800 mg a day but she's not doing that. Therefore she was to make sure that he now she's taking a sufficient. She is also stopped her fish oil. The  official causes her to be nauseated. No one has checked her cholesterol since she stopped the official. She has a history of prediabetes. Her hemoglobin A1c was 6.2 in October of last year. She is overdue to recheck that. Her prediabetes is due to her chronic immunosuppression. She also has a history of iron deficiency anemia. She is overdue to check a CBC as well as iron level. Otherwise she's been doing well with no concerns Past Medical History:  Diagnosis Date  . Alpha-1-antitrypsin deficiency (HCC)   . Asthma    alpha antitrypsin def  . Disorder of bile duct stent   . GERD (gastroesophageal reflux disease)   . History of ERCP   . Liver transplanted (HCC)   . Osteopenia   . Thyroid disease    Past Surgical History:  Procedure Laterality Date  . CHOLECYSTECTOMY  01/30/14  . ELBOW SURGERY  1974   shattered after accident  . FRACTURE SURGERY  2004/2005   left hip  . LIVER TRANSPLANT  01/30/14   due to Alpha 1   Current Outpatient Prescriptions on File Prior to Visit  Medication Sig Dispense Refill  . aspirin 325 MG EC tablet Take 325 mg by mouth daily.    . Ergocalciferol (VITAMIN D2) 2000 UNITS TABS Take by mouth.    . Ferrous Fumarate 324 (106 FE) MG TABS Take by mouth.    . Fluticasone-Salmeterol (ADVAIR DISKUS) 100-50 MCG/DOSE AEPB Inhale into the lungs.    . hydrOXYzine (ATARAX/VISTARIL) 25 MG tablet Take 1 tablet by mouth 2 (two) times daily.     Marland Kitchen levothyroxine (SYNTHROID, LEVOTHROID) 125 MCG tablet Take 1 tablet (125 mcg total) by mouth daily before breakfast. 30 tablet 5  . Multiple Vitamins-Calcium (ONE-A-DAY WOMENS FORMULA) TABS Take 1 tablet by mouth daily.    . mycophenolate (MYFORTIC) 180 MG EC tablet Take 720 mg by mouth 2 (two) times daily.    . Omega-3 Fatty Acids (FISH OIL) 1200 MG CAPS Take 1 capsule by mouth daily.    . pantoprazole (PROTONIX) 40 MG tablet Take 1 tablet by mouth 2 (two) times daily with a meal.    . tacrolimus (PROGRAF) 1 MG capsule Take 4 mg by  mouth. 4 mg qam and 3 mg pqm    . traZODone (DESYREL) 50 MG tablet Take 25 mg by mouth at bedtime.     . gabapentin (NEURONTIN) 100 MG capsule Take by mouth at bedtime.     No current facility-administered medications on file prior to visit.    Allergies  Allergen Reactions  . Fosamax [Alendronate]   . Furosemide Other (See Comments)    Abdominal pain, fever.  Lonna Duval Venom    Social History   Social History  . Marital status: Married    Spouse name: N/A  . Number of children: N/A  . Years of education: N/A   Occupational History  . Not on file.   Social History Main Topics  . Smoking status: Former Smoker    Quit date: 06/07/2012  . Smokeless  tobacco: Not on file  . Alcohol use No  . Drug use: No  . Sexual activity: Yes   Other Topics Concern  . Not on file   Social History Narrative  . No narrative on file   Family History  Problem Relation Age of Onset  . Cancer Mother     breast/uterus  . Breast cancer Mother   . Cancer Father     prostate      Review of Systems  All other systems reviewed and are negative.      Objective:   Physical Exam  Constitutional: She is oriented to person, place, and time. She appears well-developed and well-nourished. No distress.  HENT:  Head: Normocephalic and atraumatic.  Right Ear: External ear normal.  Left Ear: External ear normal.  Nose: Nose normal.  Mouth/Throat: Oropharynx is clear and moist. No oropharyngeal exudate.  Eyes: Conjunctivae and EOM are normal. Pupils are equal, round, and reactive to light. Right eye exhibits no discharge. Left eye exhibits no discharge. No scleral icterus.  Neck: Normal range of motion. Neck supple. No JVD present. No tracheal deviation present. No thyromegaly present.  Cardiovascular: Normal rate, regular rhythm, normal heart sounds and intact distal pulses.  Exam reveals no gallop and no friction rub.   No murmur heard. Pulmonary/Chest: Effort normal and breath sounds normal. No  stridor. No respiratory distress. She has no wheezes. She has no rales. She exhibits no tenderness.  Abdominal: Soft. Bowel sounds are normal. She exhibits no distension and no mass. There is no tenderness. There is no rebound and no guarding.  Musculoskeletal: Normal range of motion. She exhibits no edema or tenderness.  Lymphadenopathy:    She has no cervical adenopathy.  Neurological: She is alert and oriented to person, place, and time. She has normal reflexes. No cranial nerve deficit. She exhibits normal muscle tone. Coordination normal.  Skin: Skin is warm. No rash noted. She is not diaphoretic. No erythema. No pallor.  Psychiatric: She has a normal mood and affect. Her behavior is normal. Judgment and thought content normal.  Vitals reviewed.         Assessment & Plan:  Prediabetes - Plan: CBC with Differential/Platelet, COMPLETE METABOLIC PANEL WITH GFR, Lipid panel, Hemoglobin A1c  Other specified hypothyroidism - Plan: TSH  Hypomagnesemia - Plan: Magnesium  Iron deficiency anemia - Plan: Iron I will check the lab work as outlined in the history of present illness. Overall the patient is doing well. She denies any specific complaints. We will check a TSH, magnesium level, CBC, fasting lipid panel, and hemoglobin A1c. I would recommend lab work every 6 months to ensure stability of her chronic medical conditions

## 2016-04-27 LAB — HEMOGLOBIN A1C
Hgb A1c MFr Bld: 5.1 % (ref <5.7)
Mean Plasma Glucose: 100 mg/dL

## 2016-04-30 ENCOUNTER — Other Ambulatory Visit: Payer: Self-pay | Admitting: *Deleted

## 2016-04-30 MED ORDER — TRAZODONE HCL 50 MG PO TABS: 25.0000 mg | ORAL_TABLET | Freq: Every day | ORAL | 3 refills | Status: AC

## 2016-04-30 NOTE — Telephone Encounter (Signed)
Received call from patient.   Requested refill on Trazodone.   Refill appropriate and filled per protocol.

## 2016-05-09 ENCOUNTER — Encounter: Payer: Self-pay | Admitting: Family Medicine

## 2016-10-05 ENCOUNTER — Other Ambulatory Visit: Payer: Self-pay | Admitting: Family Medicine

## 2016-10-05 DIAGNOSIS — E038 Other specified hypothyroidism: Secondary | ICD-10-CM

## 2016-10-05 MED ORDER — HYDROXYZINE HCL 25 MG PO TABS: 25.0000 mg | ORAL_TABLET | Freq: Two times a day (BID) | ORAL | 2 refills | Status: DC

## 2016-10-05 MED ORDER — PANTOPRAZOLE SODIUM 40 MG PO TBEC
40.0000 mg | DELAYED_RELEASE_TABLET | Freq: Two times a day (BID) | ORAL | 11 refills | Status: AC
Start: 2016-10-05 — End: ?

## 2016-10-05 NOTE — Telephone Encounter (Signed)
Rx filled per protocol  

## 2016-11-14 ENCOUNTER — Ambulatory Visit (INDEPENDENT_AMBULATORY_CARE_PROVIDER_SITE_OTHER): Admitting: Physician Assistant

## 2016-11-14 ENCOUNTER — Encounter: Payer: Self-pay | Admitting: Physician Assistant

## 2016-11-14 DIAGNOSIS — E039 Hypothyroidism, unspecified: Secondary | ICD-10-CM

## 2016-11-14 LAB — TSH: TSH: 2.01 mIU/L

## 2016-11-14 NOTE — Progress Notes (Signed)
Patient ID: Margaret Tyler MRN: [id number], DOB: May 02, 1966, 51 y.o. Date of Encounter: 11/14/2016, 12:09 PM    Chief Complaint:  Chief Complaint  Patient presents with  . heart rate issues    x3days     HPI: 51 y.o. year old female that she has been getting some high blood pressure readings recently. Her blood pressure reads fine in the morning but then around mid day her blood pressure goes up. However later in the day it comes back down again. Says that she has a blood pressure cuff at home and checks BP using that monitor. I discussed that I'm concerned that if we increase blood pressure medicines to try to better control the higher readings in the afternoon,that her blood pressure is going to drop too low at the other times such as morning and evening when she is having good blood pressure readings right now on current medicines. Also discussed that we're getting good blood pressure reading at the office visit currently. She then says that she also has been having fatigue and is wondering if she is due to check her thyroid lab work. As well I did review her last visit note with Dr. Tanya Nones from 04/26/16. Also reviewed that she had full panel of labs on that date and all of those labs were normal.     Home Meds:   Outpatient Medications Prior to Visit  Medication Sig Dispense Refill  . aspirin 325 MG EC tablet Take 325 mg by mouth daily.    . Calcium Carbonate-Vit D-Min (CALCIUM 1200 PO) Take by mouth.    . Ergocalciferol (VITAMIN D2) 2000 UNITS TABS Take by mouth.    . Ferrous Fumarate 324 (106 FE) MG TABS Take by mouth.    . Fluticasone-Salmeterol (ADVAIR DISKUS) 100-50 MCG/DOSE AEPB Inhale into the lungs.    . hydrOXYzine (ATARAX/VISTARIL) 25 MG tablet Take 1 tablet (25 mg total) by mouth 2 (two) times daily. 30 tablet 2  . levothyroxine (SYNTHROID, LEVOTHROID) 125 MCG tablet TAKE 1 TABLET(125 MCG) BY MOUTH DAILY BEFORE BREAKFAST 30 tablet 3  . Multiple Vitamins-Calcium  (ONE-A-DAY WOMENS FORMULA) TABS Take 1 tablet by mouth daily.    . mycophenolate (MYFORTIC) 180 MG EC tablet Take 720 mg by mouth 2 (two) times daily.    . Omega-3 Fatty Acids (FISH OIL) 1200 MG CAPS Take 1 capsule by mouth daily.    . pantoprazole (PROTONIX) 40 MG tablet Take 1 tablet (40 mg total) by mouth 2 (two) times daily with a meal. 60 tablet 11  . tacrolimus (PROGRAF) 1 MG capsule Take 4 mg by mouth. 4 mg qam and 3 mg pqm    . traZODone (DESYREL) 50 MG tablet Take 0.5 tablets (25 mg total) by mouth at bedtime. 30 tablet 3  . gabapentin (NEURONTIN) 100 MG capsule Take by mouth at bedtime.     No facility-administered medications prior to visit.     Allergies:  Allergies  Allergen Reactions  . Fosamax [Alendronate]   . Furosemide Other (See Comments)    Abdominal pain, fever.  . Wasp Venom       Review of Systems: See HPI for pertinent ROS. All other ROS negative.    Physical Exam: Blood pressure 110/80, pulse 77, temperature 98.1 F (36.7 C), temperature source Oral, resp. rate 18, weight 171 lb 12.8 oz (77.9 kg), SpO2 96 %., Body mass index is 29.49 kg/m. General:  WNWD WF. Appears in no acute distress. Neck: Supple. No thyromegaly. No lymphadenopathy.  Lungs: Clear bilaterally to auscultation without wheezes, rales, or rhonchi. Breathing is unlabored. Heart: Regular rhythm. No murmurs, rubs, or gallops. Msk:  Strength and tone normal for age. Extremities/Skin: Warm and dry.  Neuro: Alert and oriented X 3. Moves all extremities spontaneously. Gait is normal. CNII-XII grossly in tact. Psych:  Responds to questions appropriately with a normal affect.     ASSESSMENT AND PLAN:  51 y.o. year old female with   1. Hypothyroidism, unspecified type She is on levothyroxine. Last TSH 04/26/2016---Recheck now.  - TSH   Signed, [address] [place], [place], [place] [facility] [place] 11/14/2016 12:09 PM

## 2016-11-15 ENCOUNTER — Ambulatory Visit: Payer: TRICARE For Life (TFL) | Admitting: Family Medicine

## 2016-12-31 ENCOUNTER — Ambulatory Visit
Admission: RE | Admit: 2016-12-31 | Discharge: 2016-12-31 | Disposition: A | Discharge status: Home / Self Care | Source: Ambulatory Visit | Attending: Family Medicine | Admitting: Family Medicine

## 2016-12-31 ENCOUNTER — Ambulatory Visit (INDEPENDENT_AMBULATORY_CARE_PROVIDER_SITE_OTHER): Admitting: Family Medicine

## 2016-12-31 ENCOUNTER — Encounter: Payer: Self-pay | Admitting: Family Medicine

## 2016-12-31 VITALS — BP 112/90 | HR 68 | Temp 98.4°F | Resp 16 | Ht 64.0 in | Wt 176.0 lb

## 2016-12-31 DIAGNOSIS — M25562 Pain in left knee: Secondary | ICD-10-CM

## 2016-12-31 DIAGNOSIS — F5101 Primary insomnia: Secondary | ICD-10-CM | POA: Diagnosis not present

## 2016-12-31 DIAGNOSIS — M5431 Sciatica, right side: Secondary | ICD-10-CM | POA: Diagnosis not present

## 2016-12-31 MED ORDER — OMEPRAZOLE 40 MG PO CPDR: 40.0000 mg | DELAYED_RELEASE_CAPSULE | Freq: Every day | ORAL | 3 refills | Status: AC
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
## 2017-01-03 ENCOUNTER — Other Ambulatory Visit: Payer: Self-pay | Admitting: Family Medicine

## 2017-01-03 MED ORDER — PREDNISONE 20 MG PO TABS: ORAL_TABLET | ORAL | 0 refills | Status: AC

## 2017-01-03 MED ORDER — MELOXICAM 7.5 MG PO TABS: 7.5000 mg | ORAL_TABLET | Freq: Every day | ORAL | 1 refills | Status: AC | PRN

## 2017-01-29 ENCOUNTER — Telehealth: Payer: Self-pay | Admitting: Family Medicine

## 2017-01-29 NOTE — Telephone Encounter (Signed)
Patient would like to speak to you regarding an mri that she would like to schedule that was previously discussed with dr pickard, please call her at [phone]

## 2017-02-07 ENCOUNTER — Telehealth: Payer: Self-pay | Admitting: Family Medicine

## 2017-02-07 DIAGNOSIS — G8929 Other chronic pain: Secondary | ICD-10-CM

## 2017-02-07 DIAGNOSIS — M545 Low back pain: Principal | ICD-10-CM

## 2017-02-07 NOTE — Telephone Encounter (Signed)
Pt needs MRI please have pickard put in so we can schedule. Would like to go to [facility]

## 2017-02-07 NOTE — Telephone Encounter (Signed)
MRI ordered

## 2017-03-19 NOTE — Unmapped (Signed)
Pre op complete

## 2017-03-21 ENCOUNTER — Ambulatory Visit
Admission: RE | Admit: 2017-03-21 | Discharge: 2017-03-21 | Disposition: A | Discharge status: Home / Self Care | Payer: TRICARE (CHAMPUS)

## 2017-03-21 DIAGNOSIS — R932 Abnormal findings on diagnostic imaging of liver and biliary tract: Secondary | ICD-10-CM

## 2017-03-21 DIAGNOSIS — Z79899 Other long term (current) drug therapy: Secondary | ICD-10-CM

## 2017-03-21 DIAGNOSIS — Z944 Liver transplant status: Principal | ICD-10-CM

## 2017-03-21 NOTE — Unmapped (Signed)
[facility] INTERVENTIONAL RADIOLOGY - Pre Procedure H/P      Assessment/Plan:    Karen Rogers is a 51 y.o. female who will undergo hepatic arteriogram with possible angioplasty/stenting in Interventional Radiology.    --This procedure has been fully reviewed with the patient/patient???s authorized representative. The risks, benefits and alternatives have been explained, and the patient/patient???s authorized representative has consented to the procedure.  --The patient will accept blood products in an emergent situation.  --The patient does not have a Do Not Resuscitate order in effect.      HPI: Karen Rogers is a 51 y.o. female A1AT cirrhosis s/p OLT (2015) with tardus parvus waveforms in common, right and left hepatic arteries per annual post transplant u/s (01/22/17). Subsequent MRA abdomen demonstrated stenosis of L hepatic artery and nonvisualization of proximal portion of right hepatic artery (01/22/17).    Allergies:   Allergies   Allergen Reactions   ??? Fosamax [Alendronate]      Itchy throat and rash   ??? Other      Wasp   sting   ??? Furosemide      Other reaction(s): Other (See Comments)  Abdominal pain, fever.  Trouble breathing, severe abdominal pain after 1 week       Medications:  ASA 325: held     ASA Grade: ASA 2 - Patient with mild systemic disease with no functional limitations    PE:    Vitals:    03/21/17 0833   BP: 113/94   Pulse: 75   Resp: 18   Temp: 36.9 ??C (98.5 ??F)   SpO2: 98%     General: female in NAD.  Airway assessment: Class 2 - Can visualize soft palate and fauces, tip of uvula is obscured  Lungs: Respirations nonlabored    Deidre Ala, MMSc, PA-C  03/21/2017 9:56 AM

## 2017-03-21 NOTE — Unmapped (Signed)
[facility] Interventional Radiology  Post-procedure Note    Patient: Karen Rogers    DOB: 1966-03-15  Medical Record Number: [id number]   Note Date/Time: March 21, 2017 1:57 PM     Procedure: Celiac trunk angiogram with possible angioplasty of the hepatic artery.    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    Attending: Dr. Faythe Dingwall     Fellow/Resident: Dr. Cleda Clarks    Time out: Prior to the procedure, a time out was performed with all team members present. During the time out, the patient, procedure and procedure site when applicable were verbally verified by the team members and Dr. Theodoro Doing.     Complications: NONE    Access: Right femoral artery.    Sedation: Conscious Sedation     EBL: 20 mL    Samples:  None.    Brief Description:  Accessed right groin and did an angiogram of the celiac trunk. The common hepatic artery was selected and opacified with contrast. Multiple small collaterals were demonstrated with what appeared to be a recanalized right hepatic artery, but no proper hepatic artery was visualized.  Multiple attempts were performed to try to engage the origin of the proper hepatic artery to no avail.    Plan: Recover in PRU.    Full report to follow in PACS.    Cleda Clarks, MD  March 21, 2017 1:57 PM

## 2017-03-22 NOTE — Unmapped (Signed)
Specialty Pharmacy Refill Coordination Note     Katye Valek is a 51 y.o. female contacted today regarding refills of her specialty medication(s).    Reviewed and verified with patient:     [address]  [place] [address]  Specialty medication(s) and dose(s) confirmed: yes  Changes to medications: no  Changes to insurance: no    Medication Adherence    Patient reported X missed doses in the last month:  0  Specialty Medication:  PROGRAF 1 MG  Patient is on additional specialty medications:  Yes  Additional Specialty Medications:  MYFORTIC 180 MG  Patient Reported Additional Medication X Missed Doses in the Last Month:  0  Patient is on more than two specialty medications:  Yes  Specialty Medication:  ENTECAVIR  Medication Assistance Program  Refill Coordination  Has the Patient's Contact Information Changed:  No  Is the Shipping Address Different:  No  Shipping Information  Delivery Scheduled:  Yes  Delivery Date:  03/26/17  Medications to be Shipped:  PROGRAF 1 MG  MYFORTIC 180 MG  ENTECAVIR          Prograf 1 mg   Quantity filled last month: 150   # of tablets left on hand: 50    Myfortic 180 mg   Quantity filled last month: 180   # of tablets left on hand: 60       Lavonda Jumbo

## 2017-03-25 MED ORDER — PROGRAF 1 MG CAPSULE
ORAL_CAPSULE | 3 refills | 0 days | Status: CP
Start: 2017-03-25 — End: 2018-01-28

## 2017-03-25 MED ORDER — MYCOPHENOLATE SODIUM 180 MG TABLET,DELAYED RELEASE
ORAL_TABLET | Freq: Two times a day (BID) | ORAL | 3 refills | 0.00000 days | Status: CP
Start: 2017-03-25 — End: 2018-01-28

## 2017-03-25 MED FILL — MYFORTIC/180MG/TAB: MYFORTIC/180MG/TAB | 30 days supply | Qty: 180 | Fill #1

## 2017-03-25 MED FILL — PROGRAF/1MG/CAP: PROGRAF/1MG/CAP | 30 days supply | Qty: 150 | Fill #0

## 2017-03-25 MED FILL — ENTECAVIR/0.5MG/TABS: ENTECAVIR/0.5MG/TABS | 30 days supply | Qty: 30 | Fill #7

## 2017-03-25 NOTE — Unmapped (Signed)
Received refill request for patient's brand prograf from [facility] - escript provided.

## 2017-03-27 NOTE — Unmapped (Signed)
Reviewed patient's most recent arteriogram @ 7/25 selection conference with Dr. Julieta Gutting, Dr. Rush Barer and Dr. Matilde Haymaker - VIR unable to canulate hepatic artery (hepatic artery occlusion/stenosis) with collateral formation.  No interventions at this time given stable lfts - patient remains on aspirin 325.mg daily - updated patient to this by phone.

## 2017-03-28 LAB — CBC W/ DIFFERENTIAL
BANDED NEUTROPHILS ABSOLUTE COUNT: 0 10*3/uL (ref 0.0–0.1)
BASOPHILS ABSOLUTE COUNT: 0 10*3/uL (ref 0.0–0.2)
BASOPHILS RELATIVE PERCENT: 1 %
EOSINOPHILS ABSOLUTE COUNT: 0.2 10*3/uL (ref 0.0–0.4)
HEMATOCRIT: 45.7 % (ref 34.0–46.6)
HEMOGLOBIN: 14.9 g/dL (ref 11.1–15.9)
IMMATURE GRANULOCYTES: 0 %
LYMPHOCYTES ABSOLUTE COUNT: 1.5 10*3/uL (ref 0.7–3.1)
LYMPHOCYTES RELATIVE PERCENT: 28 %
MEAN CORPUSCULAR HEMOGLOBIN CONC: 32.6 g/dL (ref 31.5–35.7)
MEAN CORPUSCULAR HEMOGLOBIN: 27.5 pg (ref 26.6–33.0)
MEAN CORPUSCULAR VOLUME: 84 fL (ref 79–97)
MONOCYTES ABSOLUTE COUNT: 0.5 10*3/uL (ref 0.1–0.9)
MONOCYTES RELATIVE PERCENT: 9 %
NEUTROPHILS RELATIVE PERCENT: 57 %
PLATELET COUNT: 129 10*3/uL — ABNORMAL LOW (ref 150–379)
RED BLOOD CELL COUNT: 5.42 x10E6/uL — ABNORMAL HIGH (ref 3.77–5.28)
RED CELL DISTRIBUTION WIDTH: 14.8 % (ref 12.3–15.4)
WHITE BLOOD CELL COUNT: 5.3 10*3/uL (ref 3.4–10.8)

## 2017-03-28 LAB — PLATELET COUNT: Lab: 129 — ABNORMAL LOW

## 2017-03-29 LAB — COMPREHENSIVE METABOLIC PANEL
A/G RATIO: 1.7 (ref 1.2–2.2)
ALBUMIN: 4.5 g/dL (ref 3.5–5.5)
ALT (SGPT): 18 IU/L (ref 0–32)
AST (SGOT): 20 IU/L (ref 0–40)
BILIRUBIN TOTAL: 0.5 mg/dL (ref 0.0–1.2)
BLOOD UREA NITROGEN: 16 mg/dL (ref 6–24)
BUN / CREAT RATIO: 17 (ref 9–23)
CALCIUM: 9.7 mg/dL (ref 8.7–10.2)
CHLORIDE: 104 mmol/L (ref 96–106)
CO2: 25 mmol/L (ref 20–29)
CREATININE: 0.95 mg/dL (ref 0.57–1.00)
GLOBULIN, TOTAL: 2.7 g/dL (ref 1.5–4.5)
GLUCOSE: 114 mg/dL — ABNORMAL HIGH (ref 65–99)
SODIUM: 143 mmol/L (ref 134–144)
TOTAL PROTEIN: 7.2 g/dL (ref 6.0–8.5)

## 2017-03-29 LAB — BILIRUBIN DIRECT: Lab: 0.15

## 2017-03-29 LAB — GLUCOSE: Lab: 114 — ABNORMAL HIGH

## 2017-03-29 LAB — MAGNESIUM: Lab: 1.7

## 2017-03-29 LAB — PHOSPHORUS, SERUM: Lab: 2.7

## 2017-03-29 LAB — GAMMA GLUTAMYL TRANSFERASE: Lab: 73 — ABNORMAL HIGH

## 2017-03-30 LAB — TACROLIMUS BLOOD: Lab: 5.3

## 2017-04-02 ENCOUNTER — Ambulatory Visit

## 2017-04-04 MED ORDER — ENTECAVIR 0.5 MG TABLET
Freq: Every day | ORAL | 3 refills | 0.00000 days | Status: CP
Start: 2017-04-04 — End: 2017-04-04

## 2017-04-04 MED ORDER — ENTECAVIR 0.5 MG TABLET: 1 mg | tablet | Freq: Every day | 3 refills | 0 days | Status: AC
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
## 2017-04-25 MED FILL — PROGRAF/1MG/CAP: PROGRAF/1MG/CAP | 90 days supply | Qty: 450 | Fill #1

## 2017-04-25 MED FILL — ENTECAVIR/0.5MG/TABS: ENTECAVIR/0.5MG/TABS | 90 days supply | Qty: 90 | Fill #0

## 2017-04-25 MED FILL — MYFORTIC/180MG/TAB: MYFORTIC/180MG/TAB | 90 days supply | Qty: 540 | Fill #0

## 2017-06-26 NOTE — Unmapped (Signed)
Standing lab orders for [facility] entered into EPIC.

## 2017-07-18 ENCOUNTER — Other Ambulatory Visit: Payer: Self-pay | Admitting: Family Medicine

## 2017-07-18 DIAGNOSIS — E038 Other specified hypothyroidism: Secondary | ICD-10-CM

## 2017-09-02 NOTE — Unmapped (Signed)
Patient requests call back 2nd week of January - doesn't need any meds at this time.

## 2017-09-09 NOTE — Unmapped (Signed)
Called patient as no labs results have been obtained since July of 2018.  Spoke with patient who notes she will return for repeat labs soon.  She notes she will be seen by pulmonary specialist locally soon for trouble breathing she will return call following visit to update coordinator on recommendations.

## 2017-09-17 NOTE — Unmapped (Signed)
[facility] Specialty Pharmacy Refill and Clinical Coordination Note  Medication(s): none filled today - gets entecavir and prograf and myfortic from [place] but not filled today    Karen Rogers, DOB: Apr 10, 1966  Phone: [phone] (home) , Alternate phone contact: N/A  Shipping address: [address] [place] [address]  Phone or address changes today?: No  All above HIPAA information verified.  Insurance changes? No    Completed refill and clinical call assessment today to schedule patient's medication shipment from the [facility] [phone]).      MEDICATION RECONCILIATION    Confirmed the medication and dosage are correct and have not changed: Yes, regimen is correct and unchanged.    Were there any changes to your medication(s) in the past month:  No, there are no changes reported at this time.    ADHERENCE    Is this medicine transplant or covered by Medicare Part B? No.  Myfortic: filled 540 for 90ds in Aug, still has 1 month+ left  Prograf: filled 450 for 90ds in Aug, still has 1 month+ left  Entecavir: filled 90 for 90ds in Aug, still has 3 weeks left  **not filling anything today, wants a refill call back next week**      Did you miss any doses in the past 4 weeks? No missed doses reported.  Adherence counseling provided? Not needed     SIDE EFFECT MANAGEMENT    Are you tolerating your medication?:  Karen Rogers reports tolerating the medication.  Side effect management discussed: None      Therapy is appropriate and should be continued.    Evidence of clinical benefit: See Epic note from 02/27/17      FINANCIAL/SHIPPING    Delivery Scheduled: Patient declined refill at this time due to has enough entecavir for a few weeks, a lot more of prograf and myfortic, requests refill call back next week.   Additional medications refilled: No additional medications/refills needed at this time.    Karen Rogers did not have any additional questions at this time.    Delivery address validated in FSI scheduling system: Yes, address listed above is correct.      We will follow up with patient monthly for standard refill processing and delivery.      Thank you,  Thad Ranger   [facility] Shared [facility] Specialty Pharmacist

## 2017-09-27 NOTE — Unmapped (Signed)
[facility] Specialty Pharmacy Refill Coordination Note  Specialty Medication(s): Myfortic 180mg  &  Prograf 1mg   Additional Medications shipped: Entecavir 0.5mg     Karen Rogers, DOB: 16-Mar-1966  Phone: [phone] (home) , Alternate phone contact: N/A  Phone or address changes today?: No  All above HIPAA information was verified with patient.  Shipping Address: [address]  [place] [address]   Insurance changes? No    Completed refill call assessment today to schedule patient's medication shipment from the [facility] [phone]).      Confirmed the medication and dosage are correct and have not changed: Yes, regimen is correct and unchanged.    Confirmed patient started or stopped the following medications in the past month:  No, there are no changes reported at this time.    Are you tolerating your medication?:  Karen Rogers reports tolerating the medication.    ADHERENCE    (Below is required for Medicare Part B or Transplant patients only - per drug):   How many tablets were dispensed last month:     Myfortic 180 mg   Quantity filled last month: 540   # of tablets left on hand: 5 Days    Prograf 1 mg   Quantity filled last month: 450   # of tablets left on hand: 5 Days    Did you miss any doses in the past 4 weeks? Yes.  Karen Rogers reports missing 10 doses of nightly meds over the course of 3 months days of medication therapy in the last 4 weeks.  Karen Rogers reports she just falls asleep. as the cause of their non-adherance.    FINANCIAL/SHIPPING    Delivery Scheduled: Yes, Expected medication delivery date: 10/01/2017     Karen Rogers did not have any additional questions at this time.    Delivery address validated in FSI scheduling system: Yes, address listed in FSI is correct.    We will follow up with patient monthly for standard refill processing and delivery.      Thank you,  Tamala Fothergill   [facility] Shared [facility] Specialty Technician

## 2017-09-29 MED FILL — MYFORTIC/180MG/TAB: MYFORTIC/180MG/TAB | 90 days supply | Qty: 540 | Fill #1

## 2017-09-29 MED FILL — ENTECAVIR/0.5MG/TABS: ENTECAVIR/0.5MG/TABS | 90 days supply | Qty: 90 | Fill #1

## 2017-09-29 MED FILL — PROGRAF/1MG/CAP: PROGRAF/1MG/CAP | 90 days supply | Qty: 450 | Fill #2
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
## 2017-11-07 ENCOUNTER — Other Ambulatory Visit: Payer: Self-pay | Admitting: Family Medicine

## 2017-11-28 LAB — CBC W/ DIFFERENTIAL
BANDED NEUTROPHILS ABSOLUTE COUNT: 0 10*3/uL (ref 0.0–0.1)
BASOPHILS ABSOLUTE COUNT: 0 10*3/uL (ref 0.0–0.2)
BASOPHILS RELATIVE PERCENT: 0 %
EOSINOPHILS ABSOLUTE COUNT: 0.2 10*3/uL (ref 0.0–0.4)
EOSINOPHILS RELATIVE PERCENT: 3 %
HEMOGLOBIN: 14.6 g/dL (ref 11.1–15.9)
IMMATURE GRANULOCYTES: 0 %
LYMPHOCYTES ABSOLUTE COUNT: 1.2 10*3/uL (ref 0.7–3.1)
LYMPHOCYTES RELATIVE PERCENT: 20 %
MEAN CORPUSCULAR HEMOGLOBIN CONC: 33 g/dL (ref 31.5–35.7)
MEAN CORPUSCULAR HEMOGLOBIN: 30 pg (ref 26.6–33.0)
MEAN CORPUSCULAR VOLUME: 91 fL (ref 79–97)
MONOCYTES ABSOLUTE COUNT: 0.5 10*3/uL (ref 0.1–0.9)
MONOCYTES RELATIVE PERCENT: 9 %
NEUTROPHILS ABSOLUTE COUNT: 3.9 10*3/uL (ref 1.4–7.0)
NEUTROPHILS RELATIVE PERCENT: 68 %
PLATELET COUNT: 172 10*3/uL (ref 150–379)
RED CELL DISTRIBUTION WIDTH: 14.2 % (ref 12.3–15.4)
WHITE BLOOD CELL COUNT: 5.8 10*3/uL (ref 3.4–10.8)

## 2017-11-28 LAB — COMPREHENSIVE METABOLIC PANEL
A/G RATIO: 1.6 (ref 1.2–2.2)
ALBUMIN: 4.2 g/dL (ref 3.5–5.5)
ALT (SGPT): 11 IU/L (ref 0–32)
AST (SGOT): 19 IU/L (ref 0–40)
BILIRUBIN TOTAL: 0.6 mg/dL (ref 0.0–1.2)
BUN / CREAT RATIO: 21 (ref 9–23)
CALCIUM: 9 mg/dL (ref 8.7–10.2)
CHLORIDE: 104 mmol/L (ref 96–106)
CO2: 22 mmol/L (ref 20–29)
GLOBULIN, TOTAL: 2.7 g/dL (ref 1.5–4.5)
GLUCOSE: 128 mg/dL — ABNORMAL HIGH (ref 65–99)
POTASSIUM: 4 mmol/L (ref 3.5–5.2)
SODIUM: 141 mmol/L (ref 134–144)
TOTAL PROTEIN: 6.9 g/dL (ref 6.0–8.5)

## 2017-11-28 LAB — AST (SGOT): Lab: 19

## 2017-11-28 LAB — PHOSPHORUS, SERUM: Lab: 3

## 2017-11-28 LAB — GAMMA GLUTAMYL TRANSFERASE: Lab: 77 — ABNORMAL HIGH

## 2017-11-28 LAB — MAGNESIUM: Lab: 1.6

## 2017-11-28 LAB — BILIRUBIN DIRECT: Lab: 0.15

## 2017-11-28 LAB — EOSINOPHILS RELATIVE PERCENT: Lab: 3

## 2017-11-29 LAB — TACROLIMUS BLOOD: Lab: 4.9

## 2017-12-05 NOTE — Unmapped (Signed)
Entered standing [facility] orders

## 2017-12-09 ENCOUNTER — Other Ambulatory Visit: Payer: Self-pay | Admitting: Family Medicine

## 2017-12-09 IMAGING — CR DG KNEE COMPLETE 4+V*L*
4 series · 4 of 4 positions shown · non-contrast
Comparison: None.

CLINICAL DATA: Medial knee pain for 2 months, no known injury,
initial encounter

EXAM:
LEFT KNEE - COMPLETE 4+ VIEW

[knee ap]
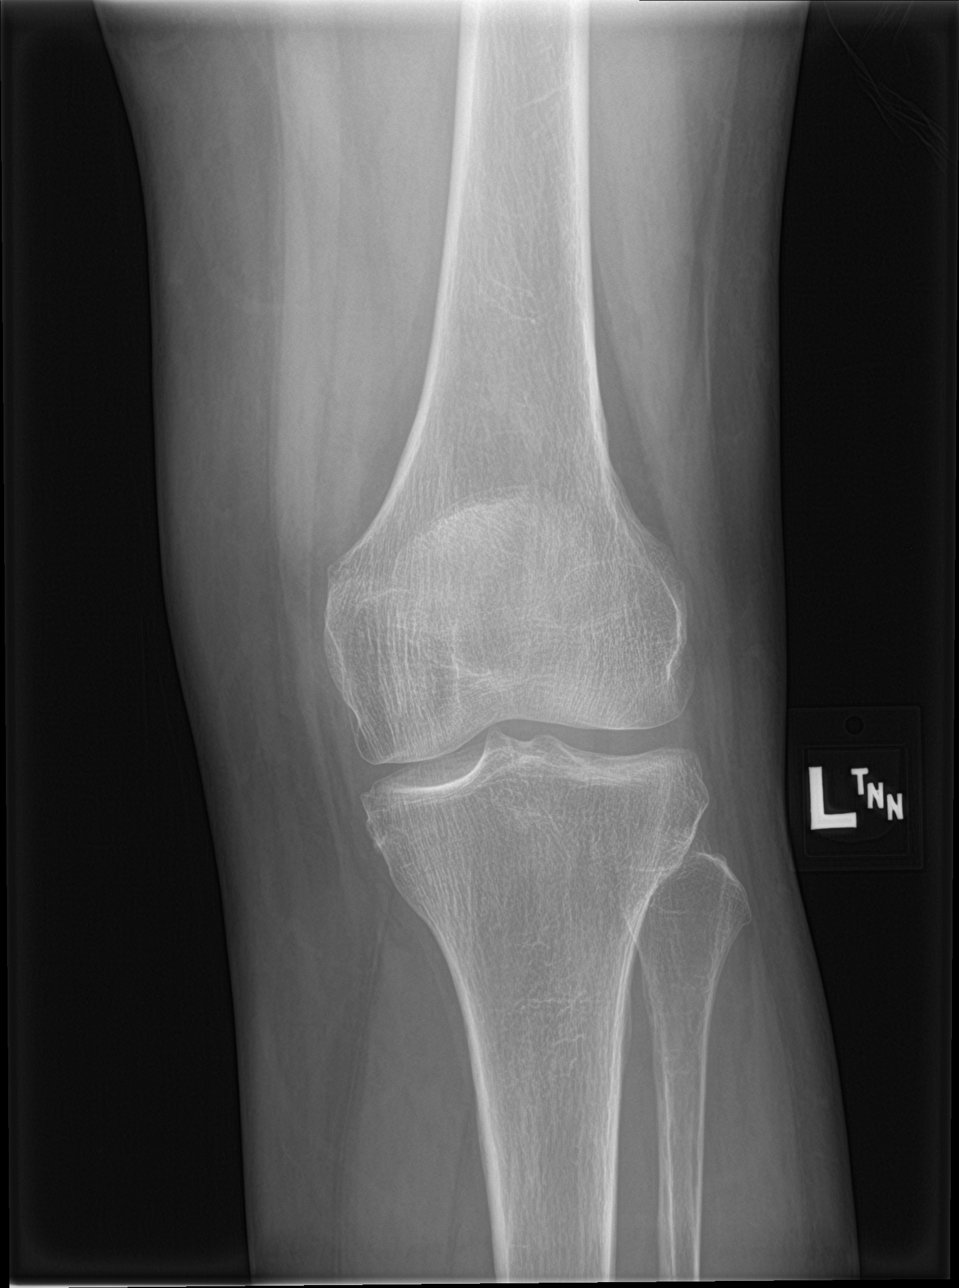

[knee obl (1 of 2)]
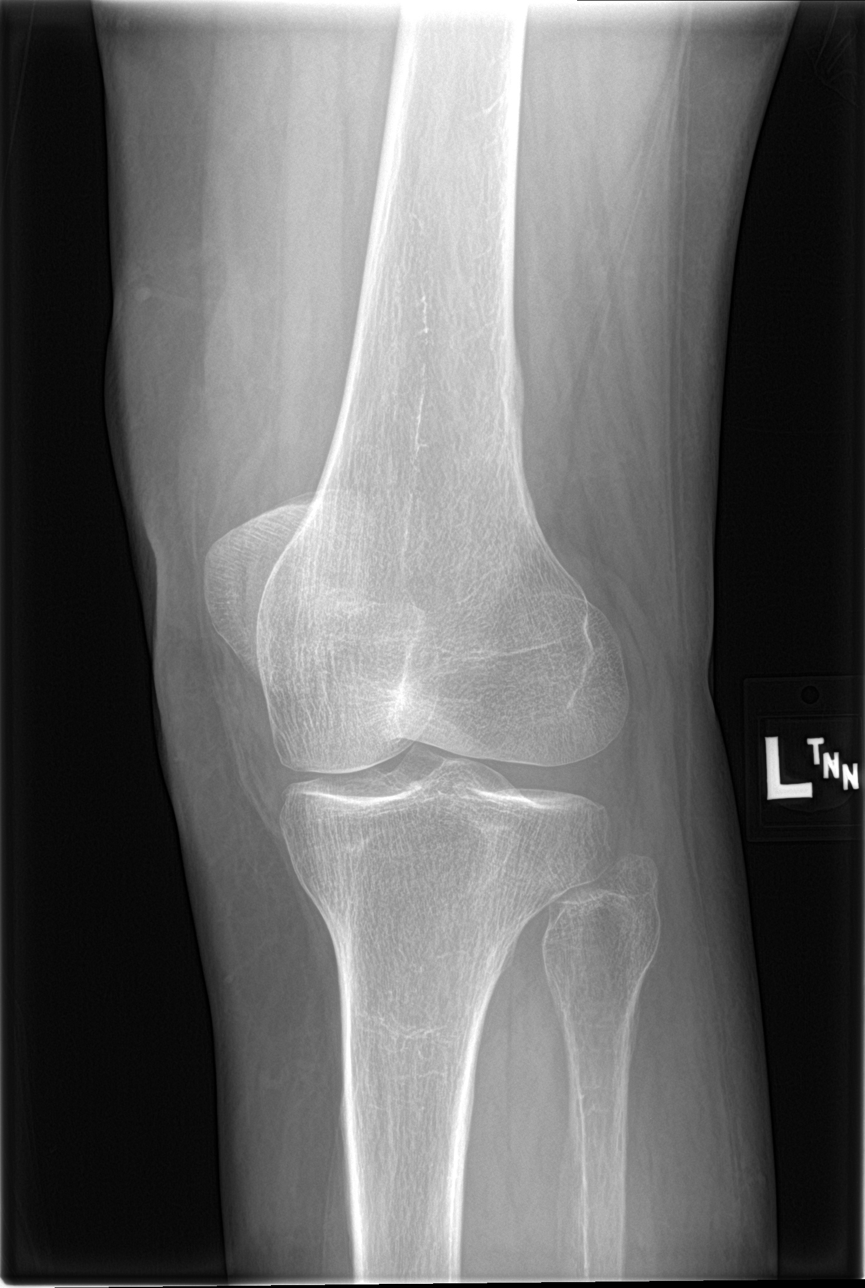

[knee obl (2 of 2)]
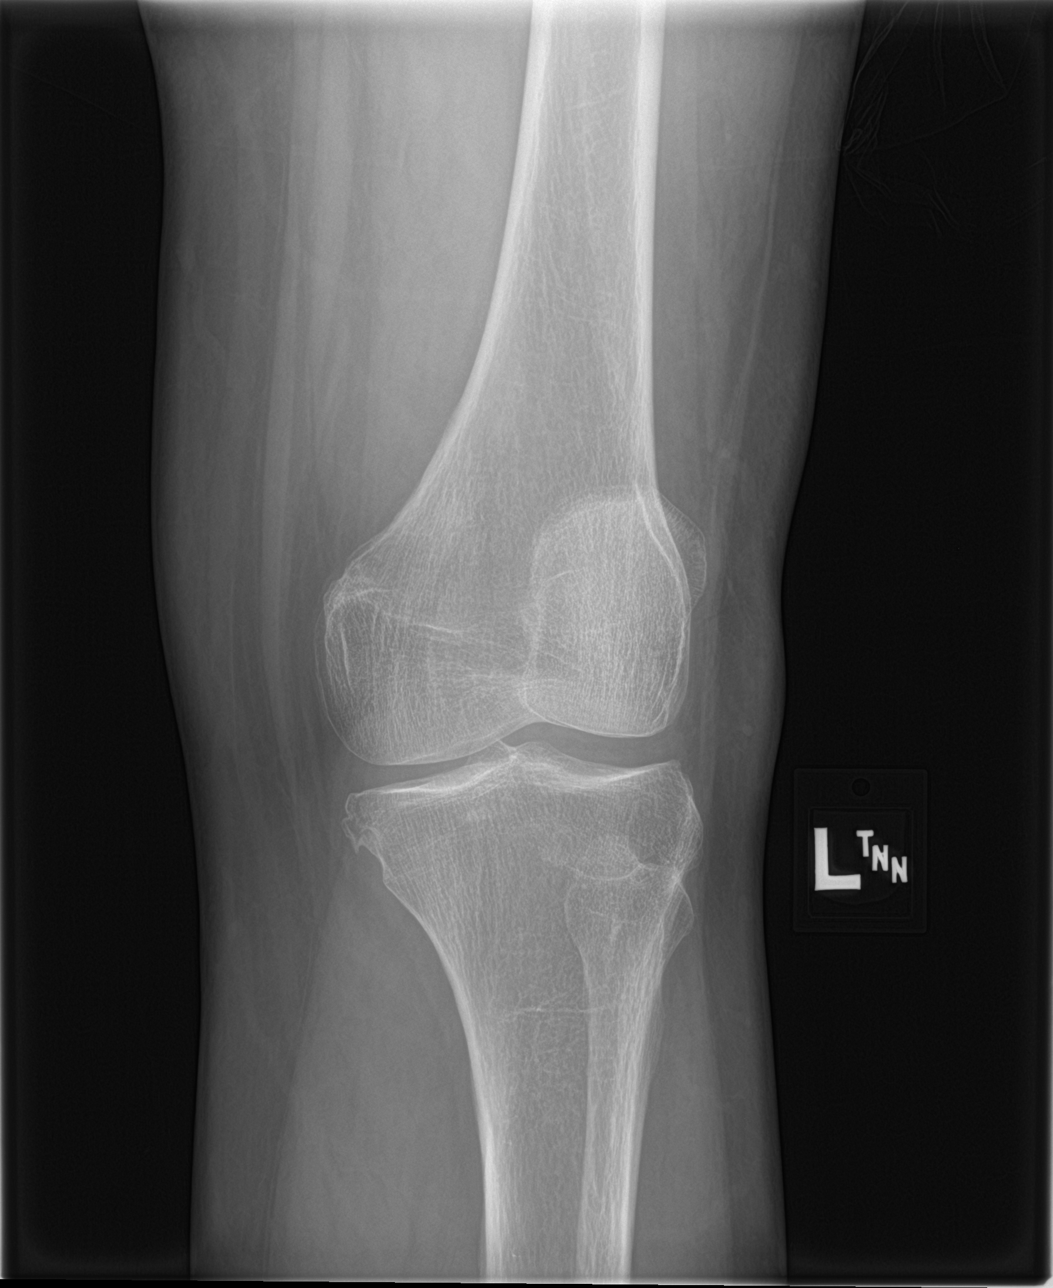

[knee lat]
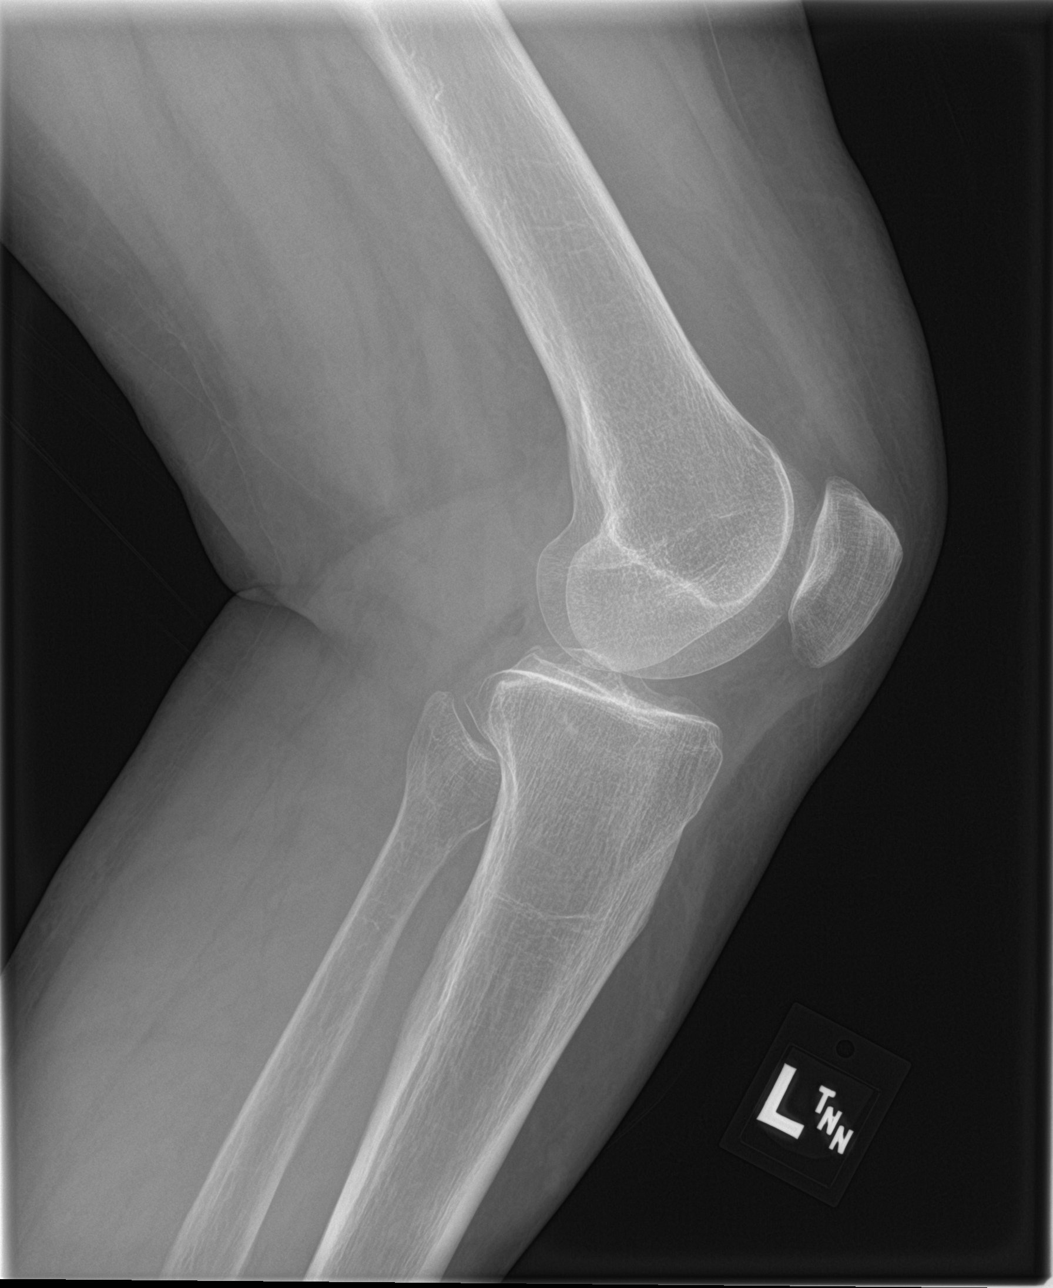

[4 of 4 positions shown; findings below may reference images not displayed]

FINDINGS: No acute fracture or dislocation is noted. No joint effusion is
seen. No soft tissue abnormality is noted.
IMPRESSION: No acute abnormality noted.

## 2017-12-09 NOTE — Unmapped (Addendum)
4/8 update: txp coordinator also unable to reach patient or daughter. Will let us know if she speaks with patient.    4/8: Only phone number listed for patient in epic and FSI has been disconnected. Contacting txp coordinator to see if they have a way to reach patient.

## 2017-12-09 NOTE — Unmapped (Addendum)
Received notification from [facility] pharmacy unable to reach patient - coordinator also attempted to call but contact # on file notes # has been discontinued.  Attempted to reach daughter by phone but no answer and voicemail box not set-up.    Looked in Care Everywhere and found contact information [phone] - left vm requesting call back.    Reached patient at new contact # [phone] - updated chart and notified [facility].

## 2017-12-10 NOTE — Unmapped (Signed)
Called patient to discuss scheduling liver transplant annual appointments, unable to reach patient left VM requesting a call back at [phone].

## 2017-12-18 NOTE — Unmapped (Signed)
Patient states she has atleast another month of her transplant meds left Requests a call back mid May. Adjusting clinical outreach call

## 2018-01-14 NOTE — Unmapped (Addendum)
Patient called to relay she has not received annual appt reminder - noted old address in chart - updated to new address and verbally reviewed upcoming appt dates and times - she verbalized understanding.

## 2018-01-15 NOTE — Unmapped (Signed)
[facility] Specialty Pharmacy Refill and Clinical Coordination Note  Medication(s): ENTECAVIR 0.5MG   Pt states she still has 2 months left of myfortic and prograf and wants a call back from [place] in 6 weeks.  Also states she paid on acct last month but still shows she has abalance, forwarding to billing.    Karen Rogers, DOB: 05-24-1966  Phone: [phone] (home) , Alternate phone contact: N/A  Shipping address: [address]  [place] [address]  Phone or address changes today?: No  All above HIPAA information verified.  Insurance changes? No    Completed refill and clinical call assessment today to schedule patient's medication shipment from the [facility] [phone]).      MEDICATION RECONCILIATION    Confirmed the medication and dosage are correct and have not changed: Yes, regimen is correct and unchanged.    Were there any changes to your medication(s) in the past month:  No, there are no changes reported at this time.    ADHERENCE    Is this medicine transplant or covered by Medicare Part B? No.    Did you miss any doses in the past 4 weeks? No missed doses reported.  Adherence counseling provided? Not needed     SIDE EFFECT MANAGEMENT    Are you tolerating your medication?:  Jeana reports tolerating the medication.  Side effect management discussed: None      Therapy is appropriate and should be continued.    Evidence of clinical benefit: See Epic note from 01/22/17      FINANCIAL/SHIPPING    Delivery Scheduled: Yes, Expected medication delivery date: 01/23/18   Additional medications refilled: No additional medications/refills needed at this time.    The patient will receive an FSI print out for each medication shipped and additional FDA Medication Guides as required.  Patient education from [place] or Robet Leu may also be included in the shipment.    none    Delivery address validated in FSI scheduling system: Yes, address listed above is correct.      We will follow up with patient monthly for standard refill processing and delivery.      Thank you,  Thad Ranger   [facility] Shared [facility] Specialty Pharmacist

## 2018-01-22 MED FILL — ENTECAVIR/0.5MG/TABS: ENTECAVIR/0.5MG/TABS | 90 days supply | Qty: 90 | Fill #2

## 2018-01-28 ENCOUNTER — Encounter: Admit: 2018-01-28 | Discharge: 2018-01-29 | Payer: TRICARE (CHAMPUS)

## 2018-01-28 ENCOUNTER — Encounter: Admit: 2018-01-28 | Discharge: 2018-01-28 | Payer: TRICARE (CHAMPUS)

## 2018-01-28 ENCOUNTER — Encounter
Admit: 2018-01-28 | Discharge: 2018-01-28 | Payer: TRICARE (CHAMPUS) | Attending: Gastroenterology | Primary: Gastroenterology

## 2018-01-28 DIAGNOSIS — B169 Acute hepatitis B without delta-agent and without hepatic coma: Secondary | ICD-10-CM

## 2018-01-28 DIAGNOSIS — Z944 Liver transplant status: Principal | ICD-10-CM

## 2018-01-28 DIAGNOSIS — B1910 Unspecified viral hepatitis B without hepatic coma: Secondary | ICD-10-CM

## 2018-01-28 DIAGNOSIS — Z79899 Other long term (current) drug therapy: Secondary | ICD-10-CM

## 2018-01-28 DIAGNOSIS — Z1159 Encounter for screening for other viral diseases: Secondary | ICD-10-CM

## 2018-01-28 DIAGNOSIS — Z23 Encounter for immunization: Secondary | ICD-10-CM

## 2018-01-28 LAB — COMPREHENSIVE METABOLIC PANEL
ALBUMIN: 4.6 g/dL (ref 3.5–5.0)
ALKALINE PHOSPHATASE: 71 U/L (ref 38–126)
ALT (SGPT): 38 U/L (ref 15–48)
ANION GAP: 12 mmol/L (ref 9–15)
AST (SGOT): 27 U/L (ref 14–38)
BILIRUBIN TOTAL: 0.6 mg/dL (ref 0.0–1.2)
BLOOD UREA NITROGEN: 17 mg/dL (ref 7–21)
BUN / CREAT RATIO: 20
CALCIUM: 9.6 mg/dL (ref 8.5–10.2)
CHLORIDE: 110 mmol/L — ABNORMAL HIGH (ref 98–107)
CO2: 23 mmol/L (ref 22.0–30.0)
CREATININE: 0.85 mg/dL (ref 0.60–1.00)
EGFR MDRD AF AMER: >=60 mL/min/{1.73_m2} (ref >=60)
EGFR MDRD NON AF AMER: >=60 mL/min/{1.73_m2} (ref >=60)
GLUCOSE RANDOM: 104 mg/dL — ABNORMAL HIGH (ref 65–99)
POTASSIUM: 4.4 mmol/L (ref 3.5–5.0)
PROTEIN TOTAL: 7.7 g/dL (ref 6.5–8.3)
SODIUM: 145 mmol/L (ref 135–145)

## 2018-01-28 LAB — SMEAR REVIEW

## 2018-01-28 LAB — CBC W/ AUTO DIFF
BASOPHILS ABSOLUTE COUNT: 0 10*9/L (ref 0.0–0.1)
BASOPHILS RELATIVE PERCENT: 0.6 %
EOSINOPHILS ABSOLUTE COUNT: 0.2 10*9/L (ref 0.0–0.4)
EOSINOPHILS RELATIVE PERCENT: 2.5 %
HEMATOCRIT: 43.1 % (ref 36.0–46.0)
HEMOGLOBIN: 13.3 g/dL (ref 12.0–16.0)
LARGE UNSTAINED CELLS: 2 % (ref 0–4)
LYMPHOCYTES ABSOLUTE COUNT: 1.1 10*9/L — ABNORMAL LOW (ref 1.5–5.0)
LYMPHOCYTES RELATIVE PERCENT: 18.1 %
MEAN CORPUSCULAR HEMOGLOBIN: 26.9 pg (ref 26.0–34.0)
MEAN CORPUSCULAR VOLUME: 87.4 fL (ref 80.0–100.0)
MEAN PLATELET VOLUME: 10.3 fL — ABNORMAL HIGH (ref 7.0–10.0)
MONOCYTES ABSOLUTE COUNT: 0.4 10*9/L (ref 0.2–0.8)
MONOCYTES RELATIVE PERCENT: 6.7 %
NEUTROPHILS ABSOLUTE COUNT: 4.1 10*9/L (ref 2.0–7.5)
NEUTROPHILS RELATIVE PERCENT: 70.2 %
PLATELET COUNT: 215 10*9/L (ref 150–440)
RED CELL DISTRIBUTION WIDTH: 14.2 % (ref 12.0–15.0)
WBC ADJUSTED: 5.8 10*9/L (ref 4.5–11.0)

## 2018-01-28 LAB — BILIRUBIN, DIRECT: BILIRUBIN DIRECT: 0.3 mg/dL (ref 0.00–0.40)

## 2018-01-28 LAB — GLUCOSE RANDOM: Glucose:MCnc:Pt:Ser/Plas:Qn:: 104 — ABNORMAL HIGH

## 2018-01-28 LAB — HEMOGLOBIN: Lab: 13.3

## 2018-01-28 LAB — PHOSPHORUS: Phosphate:MCnc:Pt:Ser/Plas:Qn:: 3.2

## 2018-01-28 LAB — TACROLIMUS, TROUGH: Lab: 6.5

## 2018-01-28 LAB — BILIRUBIN DIRECT: Bilirubin.glucuronidated:MCnc:Pt:Ser/Plas:Qn:: 0.3

## 2018-01-28 LAB — MAGNESIUM: Magnesium:MCnc:Pt:Ser/Plas:Qn:: 1.6

## 2018-01-28 LAB — HEPATITIS B SURFACE ANTIGEN: Hepatitis B virus surface Ag:PrThr:Pt:Ser:Ord:: NONREACTIVE

## 2018-01-28 LAB — GAMMA GLUTAMYL TRANSFERASE: Gamma glutamyl transferase:CCnc:Pt:Ser/Plas:Qn:: 102 — ABNORMAL HIGH

## 2018-01-28 MED ORDER — ENTECAVIR 0.5 MG TABLET
ORAL_TABLET | Freq: Every day | ORAL | 3 refills | 0.00000 days | Status: CP
Start: 2018-01-28 — End: 2018-01-28

## 2018-01-28 MED ORDER — MYCOPHENOLATE SODIUM 180 MG TABLET,DELAYED RELEASE: 540 mg | tablet | Freq: Two times a day (BID) | 3 refills | 0 days | Status: AC

## 2018-01-28 MED ORDER — MYCOPHENOLATE SODIUM 180 MG TABLET,DELAYED RELEASE
ORAL_TABLET | Freq: Two times a day (BID) | ORAL | 3 refills | 0.00000 days | Status: CP
Start: 2018-01-28 — End: 2018-01-28

## 2018-01-28 MED ORDER — PROGRAF 1 MG CAPSULE: capsule | 3 refills | 0 days

## 2018-01-28 MED ORDER — PROGRAF 1 MG CAPSULE
ORAL_CAPSULE | ORAL | 3 refills | 0.00000 days | Status: CP
Start: 2018-01-28 — End: 2018-01-28

## 2018-01-28 MED ORDER — MYFORTIC 180 MG TABLET,DELAYED RELEASE
ORAL_TABLET | ORAL | 3 refills | 0 days
Start: 2018-01-28 — End: 2018-08-07

## 2018-01-28 MED ORDER — ENTECAVIR 0.5 MG TABLET: 1 mg | tablet | Freq: Every day | 3 refills | 0 days | Status: AC

## 2018-01-28 NOTE — Unmapped (Signed)
[facility], [facility], [address]  [place], [place]  [address]  Ph: [phone]  Fax: [phone]    01/28/2018    Patient Care Team:  Tedd Sias, PA as PCP - General  Anice Paganini, MSW as Case Manager/Social Worker (Transplant)  Moises Blood, RN as Transplant Coordinator (Transplant)  [facility] as Consulting Physician (Gynecology)  Christiane Lexine Baton, FNP as Referring Physician (Gastroenterology)  Mack Hook, MD as Consulting Physician (Transplant Hepatology)    RE: Karen Rogers; DOB: 03-20-1966      Reason for visit: Status post OLT    HPI: Ms. Brownley is a pleasant 52 year old Caucasian female who is now almost 4 years status post transplant for alpha-1 antitrypsin deficiency induced cirrhosis.  She is being seen in hepatology clinic today for annual visit.  Her post transplant course has been complicated by a biliary anastomotic stricture.  Her last ERCP was on 07/04/2016 with stent being removed.  She also had rejection by biopsy on 04/14/2015 (RAI 6/9), repeat biopsy on 05/24/2015 was negative for rejection.  In regards of her immunosuppression she is currently on mycophenolate 540 mg twice daily and Prograf 3 mg in the a.m. and 2 mg in the p.m.  She is also on entecavir for a total hep B core antibody positive donor.  Tac level remains at 4???6.  Her last colonoscopy was in 2017.  Today in  clinic she overall feels well denies any fever, chills, headache, jaundice, chest pain, upper lower GI bleed, melena confusion.        PMH:  Patient Active Problem List   Diagnosis   ??? Alpha-1-antitrypsin deficiency (CMS-HCC)   ??? Asthma   ??? GERD (gastroesophageal reflux disease)   ??? Hypothyroid   ??? Liver transplant 01/30/2014     Past Medical History:   Diagnosis Date   ??? Alpha-1-antitrypsin deficiency (CMS-HCC)    ??? Asthma     better since moving to [place]   ??? Cirrhosis (CMS-HCC) 01/2012    -varices, +ascites, +encephalopathy   ??? History of transfusion ??? Hypothyroidism    ??? Liver transplant 01/30/2014 02/03/2014   ??? Osteoporosis    ??? Reflux        PSH:  Past Surgical History:   Procedure Laterality Date   ??? CHG SONO GUIDE NEEDLE BIOPSY N/A 05/24/2015    Procedure: ULTRASONIC GUIDANCE NEEDLE BX-RAD S & I;  Surgeon: Joaquin Music, MD;  Location: GI PROCEDURES MEMORIAL [facility];  Service: Gastroenterology   ??? ELBOW FRACTURE SURGERY Right    ??? LIVER TRANSPLANTATION     ??? ORIF HIP FRACTURE Left 2004    hardware in place   ??? PR COLONOSCOPY FLX DX W/COLLJ SPEC WHEN PFRMD N/A 05/08/2016    Procedure: COLONOSCOPY, FLEXIBLE, PROXIMAL TO SPLENIC FLEXURE; DIAGNOSTIC, W/WO COLLECTION SPECIMEN BY BRUSH OR WASH;  Surgeon: Joaquin Music, MD;  Location: GI PROCEDURES MEM[facility]IAL [facility];  Service: Gastroenterology   ??? PR ERCP BALLOON DILATE BILIARY/PANC DUCT/AMPULLA EA N/A 05/31/2015    Procedure: ERCP;WITH TRANS-ENDOSCOPIC BALLOON DILATION OF BILIARY/PANCREATIC DUCT(S) OR OF AMPULLA, INCLUDING SPHINCTERECTOMY, WHEN PERFOREMD,EACH DUCT (65784);  Surgeon: Malcolm Metro, MD;  Location: GI PROCEDURES MEMORIAL [facility];  Service: Gastroenterology   ??? PR ERCP BILIARY/PANC DUCT STENT EXCHANGE W/DIL&WIRE N/A 05/04/2014    Procedure: ENDOSCOPIC RETROGRADE CHOLANGIOPANCREATOGRAPHY (ERCP); WITH REMOVAL AND EXCHANGE OF STENT(S), BILIARY OR PANCREATIC DUCT;  Surgeon: Malcolm Metro, MD;  Location: GI PROCEDURES MEMORIAL [facility];  Service: Gastroenterology   ???  PR ERCP BILIARY/PANC DUCT STENT EXCHANGE W/DIL&WIRE N/A 06/08/2014    Procedure: ENDOSCOPIC RETROGRADE CHOLANGIOPANCREATOGRAPHY (ERCP); WITH REMOVAL AND EXCHANGE OF STENT(S), BILIARY OR PANCREATIC DUCT;  Surgeon: Malcolm Metro, MD;  Location: GI PROCEDURES MEMORIAL [facility];  Service: Gastroenterology   ??? PR ERCP BILIARY/PANC DUCT STENT EXCHANGE W/DIL&WIRE N/A 09/10/2014    Procedure: ENDOSCOPIC RETROGRADE CHOLANGIOPANCREATOGRAPHY (ERCP); WITH REMOVAL AND EXCHANGE OF STENT(S), BILIARY OR PANCREATIC DUCT;  Surgeon: Malcolm Metro, MD; Location: GI PROCEDURES MEMORIAL [facility];  Service: Gastroenterology   ??? PR ERCP BILIARY/PANC DUCT STENT EXCHANGE W/DIL&WIRE N/A 09/30/2015    Procedure: ENDOSCOPIC RETROGRADE CHOLANGIOPANCREATOGRAPHY (ERCP); WITH REMOVAL AND EXCHANGE OF STENT(S), BILIARY OR PANCREATIC DUCT;  Surgeon: Chriss Driver, MD;  Location: GI PROCEDURES MEMORIAL [facility];  Service: Gastroenterology   ??? PR ERCP REMOVE FOREIGN BODY/STENT BILIARY/PANC DUCT N/A 02/15/2015    Procedure: ENDOSCOPIC RETROGRADE CHOLANGIOPANCREATOGRAPHY (ERCP); W/ REMOVAL OF FOREIGN BODY/STENT FROM BILIARY/PANCREATIC DUCT(S);  Surgeon: Malcolm Metro, MD;  Location: GI PROCEDURES MEMORIAL [facility];  Service: Gastroenterology   ??? PR ERCP STENT PLACEMENT BILIARY/PANCREATIC DUCT N/A 02/23/2014    Procedure: ENDOSCOPIC RETROGRADE CHOLANGIOPANCREATOGRAPHY (ERCP); WITH PLACEMENT OF ENDOSCOPIC STENT INTO BILIARY OR PANCREATIC DUCT;  Surgeon: Malcolm Metro, MD;  Location: GI PROCEDURES MEMORIAL [facility];  Service: Gastroenterology   ??? PR ERCP STENT PLACEMENT BILIARY/PANCREATIC DUCT N/A 05/31/2015    Procedure: ENDOSCOPIC RETROGRADE CHOLANGIOPANCREATOGRAPHY (ERCP); WITH PLACEMENT OF ENDOSCOPIC STENT INTO BILIARY OR PANCREATIC DUCT;  Surgeon: Malcolm Metro, MD;  Location: GI PROCEDURES MEMORIAL [facility];  Service: Gastroenterology   ??? PR ERCP,INSERT STENT,BILIARY/PANC N/A 09/27/2014    Procedure: ERCP; W/ENDO RETRO INSRT TUBE/STENT-BILE DUCT;  Surgeon: Mayford Knife, MD;  Location: MAIN OR [facility];  Service: Gastroenterology   ??? PR ERCP,W/REMOVAL STONE,BIL/PANCR DUCTS N/A 02/23/2014    Procedure: ERCP; W/ENDOSCOPIC RETROGRADE REMOVAL OF CALCULUS/CALCULI FROM BILIARY &/OR PANCREATIC DUCTS;  Surgeon: Malcolm Metro, MD;  Location: GI PROCEDURES MEMORIAL [facility];  Service: Gastroenterology   ??? PR ERCP,W/REMOVAL STONE,BIL/PANCR DUCTS N/A 02/15/2015    Procedure: ERCP; W/ENDOSCOPIC RETROGRADE REMOVAL OF CALCULUS/CALCULI FROM BILIARY &/OR PANCREATIC DUCTS;  Surgeon: Malcolm Metro, MD; Location: GI PROCEDURES MEMORIAL [facility];  Service: Gastroenterology   ??? PR ERCP,W/REMOVAL STONE,BIL/PANCR DUCTS N/A 05/31/2015    Procedure: ERCP; W/ENDOSCOPIC RETROGRADE REMOVAL OF CALCULUS/CALCULI FROM BILIARY &/OR PANCREATIC DUCTS;  Surgeon: Malcolm Metro, MD;  Location: GI PROCEDURES MEMORIAL [facility];  Service: Gastroenterology   ??? PR ERCP,W/REMOVAL STONE,BIL/PANCR DUCTS N/A 09/30/2015    Procedure: ERCP; W/ENDOSCOPIC RETROGRADE REMOVAL OF CALCULUS/CALCULI FROM BILIARY &/OR PANCREATIC DUCTS;  Surgeon: Chriss Driver, MD;  Location: GI PROCEDURES MEMORIAL [facility];  Service: Gastroenterology   ??? PR ERCP,W/REMOVAL STONE,BIL/PANCR DUCTS N/A 07/04/2016    Procedure: ERCP; W/ENDOSCOPIC RETROGRADE REMOVAL OF CALCULUS/CALCULI FROM BILIARY &/OR PANCREATIC DUCTS;  Surgeon: Chriss Driver, MD;  Location: GI PROCEDURES MEMORIAL [facility];  Service: Gastroenterology   ??? PR NEEDLE BIOPSY LIVER N/A 04/14/2015    Procedure: BX LIVER NEEDLE; [id number];  Surgeon: Janyth Pupa, MD;  Location: GI PROCEDURES MEMORIAL [facility];  Service: Gastroenterology   ??? PR NEEDLE BIOPSY LIVER N/A 05/24/2015    Procedure: BX LIVER NEEDLE; EAVWUJ;  Surgeon: Joaquin Music, MD;  Location: GI PROCEDURES MEMORIAL [facility];  Service: Gastroenterology   ??? PR PERQ DRAINAGE PLEURA INSERT CATH W/IMAGING Right 01/22/2014    Procedure: PLEURAL DRAINAGE, PERC, W INSERTION OF INDWELLING CATHETER; W IMAGING GUIDANCE;  Surgeon: Mathis Bud, MD;  Location: BRONCH PROCEDURE LAB [facility];  Service: Pulmonary   ??? PR TRANSPLANT LIVER,ALLOTRANSPLANT  Midline 01/30/2014    Procedure: LIVER ALLOTRANSPLANTATION; ORTHOTOPIC, PARTIAL OR WHOLE, FROM CADAVER OR LIVING DONOR, ANY AGE;  Surgeon: Vivi Barrack, MD;  Location: MAIN OR [facility];  Service: Transplant       MEDICATIONS:  Current Outpatient Medications   Medication Sig Dispense Refill   ??? acetaminophen (TYLENOL) 325 MG tablet Take 2 tablets (650 mg total) by mouth every eight (8) hours as needed for pain. 30 tablet 0 ??? aspirin 325 MG tablet Take 325 mg by mouth daily.     ??? calcium carbonate 1250 MG capsule Take 1,250 mg by mouth daily.      ??? cholecalciferol, vitamin D3, 2,000 unit cap Take 1 capsule (2,000 Units total) by mouth daily. 90 capsule 3   ??? entecavir (BARACLUDE) 0.5 MG tablet Take 1 tablet (0.5 mg total) by mouth daily. 90 tablet 3   ??? fluticasone propion-salmeterol (ADVAIR DISKUS) 100-50 mcg/dose diskus Inhale.     ??? hydrOXYzine (ATARAX) 25 MG tablet Take 25 mg by mouth daily.     ??? levalbuterol (XOPENEX HFA) 45 mcg/actuation inhaler Inhale 2 puffs.     ??? levothyroxine (SYNTHROID, LEVOTHROID) 125 MCG tablet Take 125 mcg by mouth daily at 0600.     ??? mycophenolate (MYFORTIC) 180 MG EC tablet Take 3 tablets (540 mg total) by mouth two (2) times a day. 540 tablet 3   ??? norethindrone (AYGESTIN) 5 mg tablet Take 1 tab by mouth Twice daily, continue for 7 days after bleeding stops then decrease to 5mg  daily     ??? OMEGA-3/DHA/EPA/FISH OIL (FISH OIL-OMEGA-3 FATTY ACIDS) 300-1,000 mg capsule Take 1,200 mg by mouth daily.     ??? omeprazole (PRILOSEC) 20 MG capsule Take 40 mg by mouth daily.     ??? PROGRAF 1 mg capsule Take 3mg  in the morning and 2mg  in the evening 450 capsule 3     No current facility-administered medications for this visit.        ALLERGIES:  Fosamax [alendronate]; Other; and Furosemide        SH: Quit tobacco in 2013.  No alcohol use      RoS: Negative on 10 systems review other than what is mentioned above.    PE: Blood pressure 151/102, pulse 89, temperature 36.6 ??C (97.9 ??F), temperature source Tympanic, height 162.6 cm (5' 4), weight 78.7 kg (173 lb 8 oz), not currently breastfeeding. Body mass index is 29.78 kg/m??.;  WDWN, no acute distress; No scleral icterus; no xanthelasma; oropharynx clear; neck supple without lymphadenopathy, carotid bruits or jugular venous distention; Lungs were clear to the bases by auscultation and percussion; Heart: regular rate and rhythm, normal S1, S2 without significant murmur or gallop.   Extremities: no clubbing, edema or palmer erythema.  Skin: no spider angiomata, no rash; Neuropsych: normal affect, speech pattern and cognitive function; oriented x 3; no asterixis.    LABS:  Computed MELD-Na score unavailable. Necessary lab results were not found in the last year.  Computed MELD score unavailable. Necessary lab results were not found in the last year.    All lab results last 72 hours:    Recent Results (from the past 72 hour(s))   Hepatitis B Surface Antigen    Collection Time: 01/28/18 10:04 AM   Result Value Ref Range    Hepatitis B Surface Ag Nonreactive Nonreactive   Gamma GT    Collection Time: 01/28/18 10:04 AM   Result Value Ref Range    GGT 102 (H) 11 - 48 U/L  Magnesium Level    Collection Time: 01/28/18 10:04 AM   Result Value Ref Range    Magnesium 1.6 1.6 - 2.2 mg/dL   Phosphorus Level    Collection Time: 01/28/18 10:04 AM   Result Value Ref Range    Phosphorus 3.2 2.9 - 4.7 mg/dL   Bilirubin, Direct    Collection Time: 01/28/18 10:04 AM   Result Value Ref Range    Bilirubin, Direct 0.30 0.00 - 0.40 mg/dL   Comprehensive Metabolic Panel    Collection Time: 01/28/18 10:04 AM   Result Value Ref Range    Sodium 145 135 - 145 mmol/L    Potassium 4.4 3.5 - 5.0 mmol/L    Chloride 110 (H) 98 - 107 mmol/L    CO2 23.0 22.0 - 30.0 mmol/L    BUN 17 7 - 21 mg/dL    Creatinine 1.61 0.96 - 1.00 mg/dL    BUN/Creatinine Ratio 20     EGFR MDRD Non Af Amer >=60 >=60 mL/min/1.11m2    EGFR MDRD Af Amer >=60 >=60 mL/min/1.13m2    Anion Gap 12 9 - 15 mmol/L    Glucose 104 (H) 65 - 99 mg/dL    Calcium 9.6 8.5 - 04.5 mg/dL    Albumin 4.6 3.5 - 5.0 g/dL    Total Protein 7.7 6.5 - 8.3 g/dL    Total Bilirubin 0.6 0.0 - 1.2 mg/dL    AST 27 14 - 38 U/L    ALT 38 15 - 48 U/L    Alkaline Phosphatase 71 38 - 126 U/L   CBC w/ Differential    Collection Time: 01/28/18 10:04 AM   Result Value Ref Range    WBC 5.8 4.5 - 11.0 10*9/L    RBC 4.93 4.00 - 5.20 10*12/L    HGB 13.3 12.0 - 16.0 g/dL    HCT 40.9 81.1 - 91.4 %    MCV 87.4 80.0 - 100.0 fL    MCH 26.9 26.0 - 34.0 pg    MCHC 30.8 (L) 31.0 - 37.0 g/dL    RDW 78.2 95.6 - 21.3 %    MPV 10.3 (H) 7.0 - 10.0 fL    Platelet 215 150 - 440 10*9/L    Neutrophils % 70.2 %    Lymphocytes % 18.1 %    Monocytes % 6.7 %    Eosinophils % 2.5 %    Basophils % 0.6 %    Absolute Neutrophils 4.1 2.0 - 7.5 10*9/L    Absolute Lymphocytes 1.1 (L) 1.5 - 5.0 10*9/L    Absolute Monocytes 0.4 0.2 - 0.8 10*9/L    Absolute Eosinophils 0.2 0.0 - 0.4 10*9/L    Absolute Basophils 0.0 0.0 - 0.1 10*9/L    Large Unstained Cells 2 0 - 4 %    Hypochromasia Marked (A) Not Present   Morphology Review    Collection Time: 01/28/18 10:04 AM   Result Value Ref Range    Smear Review Comments See Comment (A) Undefined         ASSESSMENT:  Ms. Erleen Egner is a pleasant 52 year old Caucasian female who is now almost 4 years status post transplant for alpha-1 antitrypsin deficiency induced cirrhosis.  She is being seen in hepatology clinic today for annual visit.  Her post transplant course has been complicated by a biliary anastomotic stricture.  Her last ERCP was on 07/04/2016 with stent being removed.  She also had rejection by biopsy on 04/14/2015 (RAI 6/9), repeat biopsy on 05/24/2015 was negative for  rejection.  In regards of her immunosuppression she is currently on mycophenolate 540 mg twice daily and Prograf 3 mg in the a.m. and 2 mg in the p.m.  She is also on entecavir for a total hep B core antibody positive donor.  Tac level remains at 4???6.  Her last colonoscopy was in 2017      PLAN:  1. I have seen and examined this patient today in clinic and discussed her care with Dr. Jason Coop.  2.  Check laboratory studies.  3.  We will check the results of today's liver ultrasound chest x-ray.  4.  Continue all medications as directed.  5.  Plan to see Ms. Delmonaco back to liver clinic in 1 year.      Tina Griffiths, PA-C  [facility]  [address], [address]  [place], [place] [address]  Ph: [phone]  Fax: [phone]

## 2018-01-28 NOTE — Unmapped (Signed)
Patient seen for annual post liver transplant follow-up visit by Tina Griffiths, PA.  Patient is living with her 53 year old son who has developmental delays (she has guardianship over him).  She will finalize her divorce in ~1 month.      Patient remains on entecavir (hep b core donor), brand prograf and brand myfortic filled via [facility].  She obtains routine labs @ local [facility] (encoruaged increased monthly compliance with this as this has been poor during the last year).  She is seen for primary care @ [facility] by Tobie Lords, PA.  Noted elevated BP today 151/102 - she notes she does not check this routinely @ home bu twill do so and f/u with PCP if it remains elevated.    Patient will obtain second dose of shingrix vaccine series during clinic today to complete the series.    Patient notes ongoing local follow-up for shortness of breath - formal asthma diagnosis but other workup negative.  She will have upcoming surgical removal of endometrial polyps and had recent diagnosis of HSV II and HPV - she was counseled on safe sex practices including use of condoms and STI protection - she verbalized understanding.  She was given valtrex script by local provider for use during outbreaks or sexual activity (she is not currently sexually active).  She will alert coordinator if she plans to use this.  Additionally noted patient was started on norethindrone for ongoing periods.  Encouraged patient moving forward to always alert coordinator txp team prior to starting new medications to ensure safety with post transplant medications - she verbalized agreement.      Counseled on importance of continuing aspirin 325.mg qd given chronic hepatic artery thrombosis.  Patient does have h/o rejection thus remains with prograf goal of 4-6 and on 540.mg BID of myfortic.

## 2018-01-29 LAB — HBV DNA COMMENT: Lab: 0

## 2018-01-29 NOTE — Unmapped (Signed)
Patient's annual imaging including: chest xray and liver ultrasound, reviewed by Dr. Julieta Gutting in person with no recommendation for follow-up other than routine annual imaging per protocol.  It should be noted specific discussion with Dr. Julieta Gutting regarding liver ultrasound and chronic hepatic aretery findings - failed attempted recalculation of hepatic artery in July of 2018 thus given stable lfts no intervention at this time - patient will remain on aspirin 325.mg.

## 2018-02-19 NOTE — Unmapped (Signed)
CALLED PT SAYS SHE HAS ENOUGH CALL BACK IN 2 WEEKS

## 2018-03-04 NOTE — Unmapped (Signed)
Patient does not need a refill of specialty medication at this time. Moving specialty refill reminder call to appropriate date and removed call attempt data.  Spoke with patient and she states she still has 2 full bottles of Myfortic 180mg  & 3/4 bottle full of the Prograf 1mg  and does not need any refills at this time. Last filled 09/29/2017 for 90 days supply. Patient states she will not need a refill for another 2 to 3 weeks. Patient states she has only missed one dose of her meds due to her forgetting to take them in the morning, within the past 30 days.

## 2018-03-19 NOTE — Unmapped (Signed)
[facility] Refill Coordination Note    Specialty Medication(s) to be Shipped:   Transplant: Myfortic 180mg  and Prograf 1mg      Karen Rogers, DOB: 17-Feb-1966  Phone: [phone] (home)   Shipping Address: [address]  [place] [address]    All above HIPAA information was verified with patient.     Completed refill call assessment today to schedule patient's medication shipment from the [facility] [phone]).       Specialty medication(s) and dose(s) confirmed: Regimen is correct and unchanged.   Changes to medications: Karen Rogers reports no changes reported at this time.  Changes to insurance: No  Questions for the pharmacist: No    The patient will receive an FSI print out for each medication shipped and additional FDA Medication Guides as required.  Patient education from [place] or Robet Leu may also be included in the shipment.    DISEASE/MEDICATION-SPECIFIC INFORMATION        N/A    ADHERENCE     Medication Adherence    Patient reported X missed doses in the last month:  2  Specialty Medication:  Myfortic 180mg , Prograf 1mg  & Entecavir 0.5mg   Reasons for non-adherence:  patient forgets  Adherence tools used:  cell phone         MEDICARE PART B DOCUMENTATION     Myfortic 180mg : Patient has 10 days worth of tablets on hand.  Prograf 1mg : Patient has 10 days worth of capsules on hand.    SHIPPING     Shipping address confirmed in FSI.     Delivery Scheduled: Yes, Expected medication delivery date: 03/28/2018 via UPS or courier.     Karen Rogers   [facility] Specialty Technician

## 2018-03-27 MED FILL — PROGRAF/1MG/CAP: PROGRAF/1MG/CAP | 90 days supply | Qty: 450 | Fill #0

## 2018-03-27 MED FILL — MYFORTIC/180MG/TAB: MYFORTIC/180MG/TAB | 90 days supply | Qty: 540 | Fill #0

## 2018-04-04 NOTE — Unmapped (Signed)
Current Outpatient Medications on File Prior to Visit   Medication Sig   ??? acetaminophen (TYLENOL) 325 MG tablet Take 2 tablets (650 mg total) by mouth every eight (8) hours as needed for pain.   ??? aspirin 325 MG tablet Take 325 mg by mouth daily.   ??? calcium carbonate 1250 MG capsule Take 1,250 mg by mouth daily.    ??? cholecalciferol, vitamin D3, 2,000 unit cap Take 1 capsule (2,000 Units total) by mouth daily.   ??? entecavir (BARACLUDE) 0.5 MG tablet Take 1 tablet (0.5 mg total) by mouth daily.   ??? fluticasone propion-salmeterol (ADVAIR DISKUS) 100-50 mcg/dose diskus Inhale.   ??? hydrOXYzine (ATARAX) 25 MG tablet Take 25 mg by mouth daily.   ??? levalbuterol (XOPENEX HFA) 45 mcg/actuation inhaler Inhale 2 puffs.   ??? levothyroxine (SYNTHROID, LEVOTHROID) 125 MCG tablet Take 125 mcg by mouth daily at 0600.   ??? mycophenolate (MYFORTIC) 180 MG EC tablet Take 3 tablets (540 mg total) by mouth two (2) times a day.   ??? norethindrone (AYGESTIN) 5 mg tablet Take 1 tab by mouth Twice daily, continue for 7 days after bleeding stops then decrease to 5mg  daily   ??? OMEGA-3/DHA/EPA/FISH OIL (FISH OIL-OMEGA-3 FATTY ACIDS) 300-1,000 mg capsule Take 1,200 mg by mouth daily.   ??? omeprazole (PRILOSEC) 20 MG capsule Take 40 mg by mouth daily.   ??? PROGRAF 1 mg capsule Take 3mg  in the morning and 2mg  in the evening     No current facility-administered medications on file prior to visit.

## 2018-04-09 MED ORDER — PROGRAF 1 MG CAPSULE
ORAL_CAPSULE | 3 refills | 0 days | Status: CP
Start: 2018-04-09 — End: 2019-04-27

## 2018-04-09 MED ORDER — PROGRAF 1 MG CAPSULE: capsule | 3 refills | 0 days | Status: AC

## 2018-04-09 NOTE — Unmapped (Signed)
[facility]) medication reconciliation is completed.

## 2018-04-18 NOTE — Unmapped (Signed)
Pt says she has enough Entecavir left and does not need any at this time.  She would like [place] to call back in 3 weeks.  Set call back at that time

## 2018-05-07 NOTE — Unmapped (Signed)
Call placed to patient to encourage routine post liver transplant lab draw as last labs were obtained during annual visit in May - she verbalized understanding.

## 2018-05-14 NOTE — Unmapped (Signed)
Patient does not need a refill of specialty medication at this time. Moving specialty refill reminder call to appropriate date and removed call attempt data.  Spoke with patient and she states she still has 22 days left on her Entecavir and enough for the Myfortic and Prograf. Will call back in end of Sept to try to send all meds out at the same time.

## 2018-05-29 NOTE — Unmapped (Signed)
[facility] Refill Coordination Note    Specialty Medication(s) to be Shipped:   Transplant: Entecavir    Other medication(s) to be shipped: n/a     Karen Rogers, DOB: 09-08-1965  Phone: [phone] (home)   Shipping Address: [address]  [place] [address]    All above HIPAA information was verified with patient.     Completed refill call assessment today to schedule patient's medication shipment from the [facility] [phone]).       Specialty medication(s) and dose(s) confirmed: Regimen is correct and unchanged.   Changes to medications: Karen Rogers reports no changes reported at this time.  Changes to insurance: No  Questions for the pharmacist: No    The patient will receive a drug information handout for each medication shipped and additional FDA Medication Guides as required.      DISEASE/MEDICATION-SPECIFIC INFORMATION        N/A    ADHERENCE     Medication Adherence    Adherence tools used:  cell phone              MEDICARE PART B DOCUMENTATION     Entecavir 0.5mg : Patient has 13 on hand.    SHIPPING     Shipping address confirmed in Epic.     Delivery Scheduled: Yes, Expected medication delivery date: 06/04/18 via UPS or courier.     Karen Rogers   [facility] Specialty Pharmacist

## 2018-06-04 MED FILL — ENTECAVIR 0.5 MG TABLET: ORAL | 90 days supply | Qty: 90 | Fill #0

## 2018-06-04 MED FILL — ENTECAVIR 0.5 MG TABLET: 90 days supply | Qty: 90 | Fill #0 | Status: AC

## 2018-07-04 NOTE — Unmapped (Signed)
[facility] Refill and Clinical Coordination Note  Medication(s): prograf, myfortic, entecavir - not filling any today  Went over all meds with pt on call today. Besides meds listed in this note, pt denies needing ANYTHING else filled at this time. Pt aware to call us with any needs/concerns/refills if need arises prior to our next scheduled refill call.      Karen Rogers, DOB: 11-May-1966  Phone: [phone] (home) , Alternate phone contact: N/A  Shipping address: [address]  [place] [address]  Phone or address changes today?: No  All above HIPAA information verified.  Insurance changes? No    Completed refill and clinical call assessment today to schedule patient's medication shipment from the [facility] [phone]).      MEDICATION RECONCILIATION    Confirmed the medication and dosage are correct and have not changed: Yes, regimen is correct and unchanged.    Were there any changes to your medication(s) in the past month:  No, there are no changes reported at this time.    ADHERENCE    Is this medicine transplant or covered by Medicare Part B? Yes.    Myfortic 180mg : Patient has 1 months worth of tablets on hand.  Prograf 1mg : Patient has 1 month worth of capsules on hand.- NOT FILLING EITHER TODAY DUE TO SUPPLY ON HAND     Did you miss any doses in the past 4 weeks? No missed doses reported.  Adherence counseling provided? Not needed     SIDE EFFECT MANAGEMENT    Are you tolerating your medication?:  Karen Rogers reports tolerating the medication.  Side effect management discussed: None      Therapy is appropriate and should be continued.    Evidence of clinical benefit: See Epic note from 01/28/18      FINANCIAL/SHIPPING    Delivery Scheduled: Patient declined refill at this time due to still has over a month of all medications in stock, requests call back in 3.5 weeks, aware to call us with any needs/concerns prior to then.     Medication will be delivered via n/a to the n/a address in [facility]    Additional medications refilled: No additional medications/refills needed at this time.    The patient will receive a drug information handout for each medication shipped and additional FDA Medication Guides as required.      Karen Rogers had the following additional questions or need for a follow-up phone call from the Clinical Pharmacy Team: patient's PCP has dx her with high cholesterol and told her to work on diet and exercise. She asked if txp meds could cause this- per lexicomp this is very common. Pt will call txp coordinator, I will message her as well    Delivery address confirmed in Epic.     We will follow up with patient monthly for standard refill processing and delivery.      Thank you,  Karen Rogers   [facility] Shared [facility] Specialty Pharmacist

## 2018-07-29 NOTE — Unmapped (Signed)
Patient left message that her insurance with [employer] has been cancelled.  Requested letter to support importance of it being reinstated ASAP - faxed to PCP office for patient retrieval per her request.      Patient reports good supply of meds currently.  Coordinator provided contact information for [facility] - reviewed importance of completing paperwork ASA and 14 day temporary benefit.  Discussed importance of completing this now despite good supply of meds to ensure approval in timely fashion while insurance reinstatement is pending.

## 2018-08-05 NOTE — Unmapped (Addendum)
[facility] Refill and Clinical Coordination Note  Medication(s): Myfortic 180mg , Prograf 1mg     Karen Rogers, DOB: 04-26-1966  Phone: [phone] (home) , Alternate phone contact: N/A  Shipping address: [address]  [place] [address]  Phone or address changes today?: No  All above HIPAA information verified.  Insurance changes? No    Completed refill and clinical call assessment today to schedule patient's medication shipment from the [facility] [phone]).      MEDICATION RECONCILIATION    Confirmed the medication and dosage are correct and have not changed: Yes, regimen is correct and unchanged.    Were there any changes to your medication(s) in the past month:  No, there are no changes reported at this time.    ADHERENCE    Is this medicine transplant or covered by Medicare Part B? Yes.    Myfortic 180mg : Patient has 10 days worth of tablets on hand.  Prograf 1mg : Patient has 10 days worth of capsules on hand.    Did you miss any doses in the past 4 weeks? Missed 2 doses in the past 4 weeks  Adherence counseling provided? Yes, discussed the following ways to help with adherence: pill box, alarms.     SIDE EFFECT MANAGEMENT    Are you tolerating your medication?:  Karen Rogers reports tolerating the medication.  Side effect management discussed: None      Therapy is appropriate and should be continued.    Evidence of clinical benefit: See Epic note from 01/28/18      FINANCIAL/SHIPPING    Delivery Scheduled: Yes, Expected medication delivery date: 08/07/18   **ADDENDA: mycophenolate delayed due to PAP processing error, sending for 08/08/18 delivery, pt is now aware.**    Medication will be delivered via UPS to the home address in [facility].    Additional medications refilled: No additional medications/refills needed at this time.    The patient will receive a drug information handout for each medication shipped and additional FDA Medication Guides as required.      [name] did not have any additional questions at this time.    Delivery address confirmed in Epic.     We will follow up with patient monthly for standard refill processing and delivery.      Thank you,  Tera Helper   [facility] Specialty Pharmacist

## 2018-08-06 MED FILL — PROGRAF 1 MG CAPSULE: 14 days supply | Qty: 70 | Fill #0

## 2018-08-06 MED FILL — PROGRAF 1 MG CAPSULE: 14 days supply | Qty: 70 | Fill #0 | Status: AC

## 2018-08-07 MED ORDER — MYCOPHENOLATE SODIUM 180 MG TABLET,DELAYED RELEASE
ORAL_TABLET | Freq: Two times a day (BID) | ORAL | 3 refills | 90 days | Status: CP
Start: 2018-08-07 — End: 2019-08-07
  Filled 2018-08-07: qty 84, 14d supply, fill #0

## 2018-08-07 MED FILL — MYCOPHENOLATE SODIUM 180 MG TABLET,DELAYED RELEASE: 14 days supply | Qty: 84 | Fill #0 | Status: AC

## 2018-08-19 NOTE — Unmapped (Signed)
Patient does not need a refill of specialty medication at this time. Moving specialty refill reminder call to appropriate date and removed call attempt data.  Spoke with patient and she states she is in between insurances and she is applying for PAP, but also trying to reinstate her T[employer]are. Patient states no need to worry, she still has plenty of medication ( Mycophenolate 180mg , Prograf and Entecavir) on hand and would like for [facility] to reach out to her next year. Patient also received 14 days supply on PAP. And has not missed any doses.

## 2018-09-08 NOTE — Unmapped (Signed)
Patient does not need a refill of specialty medication at this time. Moving specialty refill reminder call to appropriate date and removed call attempt data.  Spoke with patient and she states she has 3 weeks worth of medication on hand for her Mycophenolic 180mg , Prograf 1mg  and Entecavir 0.5mg .  Patient states she is in the process of filling out the PAP Application (Due to her not having any current/ active insurance coverage)  and she states she will drop off the application to the hospital for approval next week.    Patient confirmed she has not missed any doses.

## 2018-09-08 NOTE — Unmapped (Signed)
Spoke with patient by phone to relay importance of completing Pharmacy Assistance Paperwork - she confirms she has 3 weeks worth of meds currently and plans to complete application ASAP.  She reports he husband is currently in the hospital and she requires copy of the most recent tax return - once this is colleted she will present to [facility] financial counselor for same day approval.      Encouraged patient to consider repeating labs when she comes to [facility] for PAP approval - discussed applying for [facility] of payment plan to cover cost - she notes she is not comfortable with this at this time - stressed importance of ongoing lab monitoring to patient.      [facility] previously mentioned patient had questions about local PCP starting cholesterol medication - confirmed for patient if local PCP suggested she start medication for this our providers would be in agreement- once insurance re-instated she plans to re-visit this with local PCP.

## 2018-09-17 NOTE — Unmapped (Signed)
PT denied specialty meds. PT states that she has over 2.5 weeks worth of medication on hand. PT also stated that she will be turning in her PAP paperwork this week. Re-scheduling refill call to appropriate date.

## 2018-09-25 NOTE — Unmapped (Signed)
Patient came to transplant clinic to alert coordinator that Pharmacy Assistance Application has been approved onsite today - she will reach out to [facility] to discuss shipment of medication (reports she still has ~ 1 week left).  Encouraged her to consider other outside scripts being sent to [facility] to use benefit of PAP approval.  She declined getting labs drawn today given no active insurance - encouraged consideration of applying to [facility] but she reports she expects insurance to be re-instated soon - she will update coordinator when this is finalized.  Stressed importance of routine lab draws.

## 2018-09-30 NOTE — Unmapped (Signed)
[facility] Refill Coordination Note    Specialty Medication(s) to be Shipped:   Transplant:  mycophenolic acid 180mg , Prograf 1mg  and Entecavir 0.5mg      Karen Rogers, DOB: 1966-06-10  Phone: [phone] (home)     All above HIPAA information was verified with patient.     Completed refill call assessment today to schedule patient's medication shipment from the [facility] [phone]).       Specialty medication(s) and dose(s) confirmed: Regimen is correct and unchanged.   Changes to medications: Mela reports no changes reported at this time.  Changes to insurance: No  Questions for the pharmacist: No    The patient will receive a drug information handout for each medication shipped and additional FDA Medication Guides as required.      DISEASE/MEDICATION-SPECIFIC INFORMATION        N/A    ADHERENCE     Medication Adherence    Patient reported X missed doses in the last month:  0  Specialty Medication:  Mycophenolic 180mg  & Prograf 1mg    Patient is on additional specialty medications:  No  Patient is on more than two specialty medications:  No  Any gaps in refill history greater than 2 weeks in the last 3 months:  no  Demonstrates understanding of importance of adherence:  yes  Informant:  patient  Reliability of informant:  reliable  Adherence tools used:  cell phone  Confirmed plan for next specialty medication refill:  delivery by pharmacy          Refill Coordination    Has the Patients' Contact Information Changed:  No  Is the Shipping Address Different:  No         MEDICARE PART B DOCUMENTATION     Mycophenolic acid 180mg : Patient has 6 days worth of tablets on hand.  Prograf 1mg : Patient has 6 days worth of capsules on hand.  Entecavir 0.5mg : Patient has 6 days worth of on hand.    SHIPPING     Shipping address confirmed in Epic.     Delivery Scheduled: Yes, Expected medication delivery date: 10/03/2018 via UPS or courier.     Medication will be delivered via UPS to the home address in [facility] [place].    [name] P Allena Katz   [facility] Shared [facility] Specialty Technician

## 2018-10-02 MED FILL — MYCOPHENOLATE SODIUM 180 MG TABLET,DELAYED RELEASE: ORAL | 90 days supply | Qty: 540 | Fill #1

## 2018-10-02 MED FILL — MYCOPHENOLATE SODIUM 180 MG TABLET,DELAYED RELEASE: 90 days supply | Qty: 540 | Fill #1 | Status: AC

## 2018-10-02 MED FILL — PROGRAF 1 MG CAPSULE: 90 days supply | Qty: 450 | Fill #1

## 2018-10-02 MED FILL — ENTECAVIR 0.5 MG TABLET: 90 days supply | Qty: 90 | Fill #1 | Status: AC

## 2018-10-02 MED FILL — PROGRAF 1 MG CAPSULE: 90 days supply | Qty: 450 | Fill #1 | Status: AC

## 2018-10-02 MED FILL — ENTECAVIR 0.5 MG TABLET: ORAL | 90 days supply | Qty: 90 | Fill #1

## 2018-10-14 NOTE — Unmapped (Signed)
Patient called to relay she plans to apply for Medicaid - confirmed this is in her best interest.

## 2018-11-24 DIAGNOSIS — Z944 Liver transplant status: Principal | ICD-10-CM

## 2018-11-24 DIAGNOSIS — Z79899 Other long term (current) drug therapy: Principal | ICD-10-CM

## 2018-11-24 NOTE — Unmapped (Signed)
Patient called to relay concern about sending he son back to school and requested letter to support she is immunosuppressed - confirmed for patient [place] [employer] closed until May 15 - thus no letter required at this time.      During call encouraged patient to repeat routine labs as last labs drawn in May of 2019 - she reports she doesn't feel comfortable going to lab now due to COVID-19 but will when she feels it is safe.      Noted patient with cough - she questioned about this she reports its the flu and she has had it for weeks - patient declined further purist of traige of her symptoms siting neighbor had flu and she gets this every year.  Continued to encourage patient to seek additional medication attention should symptoms worsen.  Referenced [facility] center for traige of respiratory symptoms and encouraged patient to consider use of this resource.

## 2018-12-12 NOTE — Unmapped (Signed)
Closing old encounter

## 2018-12-12 NOTE — Unmapped (Signed)
[facility] Clinical Assessment & Refill Coordination Note    Karen Rogers, DOB: Jun 10, 1966  Phone: [phone] (home)     All above HIPAA information was verified with patient.     Specialty Medication(s):   Transplant:  mycophenolic acid 1mg , Prograf 180mg  and entecavir 0.5mg      Current Outpatient Medications   Medication Sig Dispense Refill   ??? fluticasone propion-salmeterol (ADVAIR DISKUS) 100-50 mcg/dose diskus Inhale. Only takes in the winter     ??? levalbuterol (XOPENEX HFA) 45 mcg/actuation inhaler Inhale 2 puffs. As needed     ??? omeprazole (PRILOSEC) 20 MG capsule Take 20 mg by mouth daily. 4/10: pt takes omep 20 every other day     ??? acetaminophen (TYLENOL) 325 MG tablet Take 2 tablets (650 mg total) by mouth every eight (8) hours as needed for pain. 30 tablet 0   ??? aspirin 325 MG tablet Take 325 mg by mouth daily.     ??? calcium carbonate 1250 MG capsule Take 1,250 mg by mouth daily.      ??? cholecalciferol, vitamin D3, 2,000 unit cap Take 1 capsule (2,000 Units total) by mouth daily. 90 capsule 3   ??? entecavir (BARACLUDE) 0.5 MG tablet TAKE 1 TABLET BY MOUTH ONCE DAILY 90 each 3   ??? hydrOXYzine (ATARAX) 25 MG tablet Take 25 mg by mouth daily.     ??? levothyroxine (SYNTHROID, LEVOTHROID) 125 MCG tablet Take 125 mcg by mouth daily at 0600.     ??? mycophenolate (MYFORTIC) 180 MG EC tablet Take 3 tablets (540 mg total) by mouth Two (2) times a day. 540 tablet 3   ??? norethindrone (AYGESTIN) 5 mg tablet Take 1 tab by mouth Twice daily, continue for 7 days after bleeding stops then decrease to 5mg  daily     ??? OMEGA-3/DHA/EPA/FISH OIL (FISH OIL-OMEGA-3 FATTY ACIDS) 300-1,000 mg capsule Take 1,200 mg by mouth daily.     ??? PROGRAF 1 mg capsule TAKE 3 CAPSULES (3 MG) BY MOUTH IN THE MORNING AND TAKE 2 CAPSULES (2 MG) IN THE EVENING 450 capsule 3     No current facility-administered medications for this visit.         Changes to medications: aygestin is d/c, omeprazole dec to 20mg  every other day, advair is used only in winter and started beet root recently due to fatigue - she said clinic is not aware of these changes. advised pt to contact clinic with any changes and prior to starting any new meds, including otcs and herbals- i will let clinic know as well    Allergies   Allergen Reactions   ??? Fosamax [Alendronate]      Itchy throat and rash   ??? Other      Wasp   sting   ??? Furosemide      Other reaction(s): Other (See Comments)  Abdominal pain, fever.  Trouble breathing, severe abdominal pain after 1 week       Changes to allergies: No    SPECIALTY MEDICATION ADHERENCE     Prograf 1mg   :over  3 weeks of medicine on hand   Mycophenolate 180mg   : over 3 weeks of medicine on hand   entecavir 0.5mg   : 10 days of medicine on hand       Medication Adherence    Patient reported X missed doses in the last month:  2  Specialty Medication:  entecavir 0.5mg   Patient is on additional specialty medications:  Yes  Additional Specialty  Medications:  Mycophenolate 180mg   Patient Reported Additional Medication X Missed Doses in the Last Month:  0  Patient is on more than two specialty medications:  Yes  Specialty Medication:  prograf 1mg   Patient Reported Additional Medication X Missed Doses in the Last Month:  0  Adherence tools used:  cell phone          Specialty medication(s) dose(s) confirmed: Regimen is correct and unchanged.     Are there any concerns with adherence? Yes: patient states she has fallen asleep 2 times in last 90 days and not taken nighttime med doses. pt does use a pillbox and sometimes uses an alarm. i counseled her to always set alarm for all med doses, she will consider it    Adherence counseling provided? Yes: see above    CLINICAL MANAGEMENT AND INTERVENTION      Clinical Benefit Assessment:    Do you feel the medicine is effective or helping your condition? Yes    Clinical Benefit counseling provided? Not needed    Adverse Effects Assessment:    Are you experiencing any side effects? patient says has ongoing fatigue. she has talked about it with clinic in past but nothing seems to help. as per above in note, patient recently started otc beet root for this but did not speak with clinic first - messaging clinic. pt aware now to contact with any issues/new meds/med changes/etc    Are you experiencing difficulty administering your medicine? No    Quality of Life Assessment:    How many days over the past month did your transplant  keep you from your normal activities? For example, brushing your teeth or getting up in the morning. 0    Have you discussed this with your provider? Not needed    Therapy Appropriateness:    Is therapy appropriate? Yes, therapy is appropriate and should be continued    DISEASE/MEDICATION-SPECIFIC INFORMATION      N/A    PATIENT SPECIFIC NEEDS     ? Does the patient have any physical, cognitive, or cultural barriers? No    ? Is the patient high risk? Yes, patient taking a REMS drug     ? Does the patient require a Care Management Plan? No     ? Does the patient require physician intervention or other additional services (i.e. nutrition, smoking cessation, social work)? No      SHIPPING     Specialty Medication(s) to be Shipped:   Transplant: entecavir 0.5mg     Other medication(s) to be shipped: none - pt wants call back in 2-3 weeks on prograf and mycophenolate. States all other meds come locally or otc     Changes to insurance: Yes: patient states that tricare is now back. did eligibility search and it did come up - she gave me new id and i entered it and set it as primary insurance. also added note in work order to use tricare and call pt if any issues per her request    Delivery Scheduled: Yes, Expected medication delivery date: 12/19/2018.     Medication will be delivered via UPS to the confirmed home address in [facility].    The patient will receive a drug information handout for each medication shipped and additional FDA Medication Guides as required. Verified that patient has previously received a Conservation officer, historic buildings.    Thad Ranger   [facility] Specialty Pharmacist

## 2018-12-18 MED FILL — ENTECAVIR 0.5 MG TABLET: 90 days supply | Qty: 90 | Fill #2 | Status: AC

## 2018-12-18 MED FILL — ENTECAVIR 0.5 MG TABLET: ORAL | 90 days supply | Qty: 90 | Fill #2

## 2018-12-25 NOTE — Unmapped (Signed)
Patient denied needing anything filled. She said she still has 1 month supply of Prograf and Myfortic and requested a call back in 3 weeks.       [facility] Refill Coordination Note    Specialty Medication(s) to be Shipped:   None    Other medication(s) to be shipped: n/a     Karen Rogers, DOB: February 04, 1966  Phone: [phone] (home)       All above HIPAA information was verified with patient.     Completed refill call assessment today to schedule patient's medication shipment from the [facility] [phone]).       Specialty medication(s) and dose(s) confirmed: Regimen is correct and unchanged.   Changes to medications: Karen Rogers reports no changes at this time.  Changes to insurance: No  Questions for the pharmacist: No    Confirmed patient received Welcome Packet with first shipment. The patient will receive a drug information handout for each medication shipped and additional FDA Medication Guides as required.       DISEASE/MEDICATION-SPECIFIC INFORMATION        N/A    SPECIALTY MEDICATION ADHERENCE     Medication Adherence    Adherence tools used:  cell phone                         Prgraf 1 mg: 30 days of medicine on hand   Mycophenolate 180 mg: 30 days of medicine on hand          SHIPPING     Shipping address confirmed in Epic.     Delivery Scheduled: Patient denied fill       Karen Rogers   [facility] Shared [facility] Specialty Pharmacist

## 2019-01-14 NOTE — Unmapped (Signed)
[facility] Refill Coordination Note    Specialty Medication(s) to be Shipped:   Transplant:  mycophenolic acid 180mg  and Prograf 1mg     Other medication(s) to be shipped:      Karen Rogers, DOB: [birth year]  Phone: [phone] (home)       All above HIPAA information was verified with patient.     Completed refill call assessment today to schedule patient's medication shipment from the [facility] [phone]).       Specialty medication(s) and dose(s) confirmed: Regimen is correct and unchanged.   Changes to medications: [name] reports no changes at this time.  Changes to insurance: No  Questions for the pharmacist: No    Confirmed patient received Welcome Packet with first shipment. The patient will receive a drug information handout for each medication shipped and additional FDA Medication Guides as required.       DISEASE/MEDICATION-SPECIFIC INFORMATION        N/A    SPECIALTY MEDICATION ADHERENCE     Medication Adherence    Patient reported X missed doses in the last month:  0  Specialty Medication:  mycophenolate  Patient is on additional specialty medications:  Yes  Additional Specialty Medications:  prograf  Patient Reported Additional Medication X Missed Doses in the Last Month:  0  Adherence tools used:  cell phone                prograf 1 mg: 14 days of medicine on hand   mycophenolate 180 mg: 14 days of medicine on hand       SHIPPING     Shipping address confirmed in Epic.     Delivery Scheduled: Yes, Expected medication delivery date: 01/23/19.     Medication will be delivered via UPS to the home address in [facility].    Oralia Rud   [facility] Specialty Technician

## 2019-01-19 NOTE — Unmapped (Signed)
Call placed to patient to discuss upcoming annual post liver transplant visit will be completed with provider via virtual video visit - patient agreeable to this.  Annual imaging will be scheduled with lab draw in August - patient agreeable, travel screening completed.      Discussed with patient she has not had lab draw since May of 2019 - stressed importance of routine lab draws - she is hesitant to complete these related to COVID-19 - reviewed precautions she can take to safely have labs drawn including wearing a mask, handwashing and social distancing in waiting area.

## 2019-01-22 MED FILL — MYCOPHENOLATE SODIUM 180 MG TABLET,DELAYED RELEASE: ORAL | 90 days supply | Qty: 540 | Fill #2

## 2019-01-22 MED FILL — PROGRAF 1 MG CAPSULE: 90 days supply | Qty: 450 | Fill #2

## 2019-01-22 MED FILL — PROGRAF 1 MG CAPSULE: 90 days supply | Qty: 450 | Fill #2 | Status: AC

## 2019-01-22 MED FILL — MYCOPHENOLATE SODIUM 180 MG TABLET,DELAYED RELEASE: 90 days supply | Qty: 540 | Fill #2 | Status: AC

## 2019-01-23 NOTE — Unmapped (Signed)
Call placed to patient in preparation for 5/26 annual post liver transplant annual virtual visit with Tina Griffiths, PA.  Reviewed and updated medication list in Epic and completed virtual rooming activities.    Patient denies N/V/F - continues to have chronic diarrhea that is unchanged since post translpant.  Denies alcohol, tobacco or drug use.  She is not currently working given her son with severe cognitive delays is out of school.  She enjoys upcycling furniture as hobby.    Patient has not obtained lab draw since May of 2019 - coordinator has encouraged this on numerous occasions - reinforced lab compliance to promote ongoing health of transplanted liver particularly in light of history of rejection.  She is committed to having labs drawn within the next 2 weeks and verbalized understanding of importance of this.    Patient remains on entecavir (hep b core donor), brand prograf and brand myfortic filled via [facility].  Lab orders are current for routine draws @ local [facility].  She is seen for primary care by [facility] provider Tobie Lords, PA.     Patient had surgical gyn procedures to remove polyps.  She has HSV II and HPV and was counseled on safe sex practices including use of condoms for STI protection - she verbalized understanding.       She continues to take aspirin 325.mg qd given chronic hepatic artery thrombosis.  Last colonoscopy was in 2017 with plans for repeat in 10 years due ~2027.    Patient reports significant weight gain - encouraged healthy diet and exercise and f/u with PCP for local nutritionist referral and lab draw to ensure thyroid function is stable.     Annual imaging and labs scheduled in August given COVID-19 precautions.

## 2019-01-27 ENCOUNTER — Telehealth
Admit: 2019-01-27 | Discharge: 2019-01-28 | Payer: TRICARE (CHAMPUS) | Attending: Gastroenterology | Primary: Gastroenterology

## 2019-01-27 NOTE — Unmapped (Signed)
[facility], [facility], [place]  [address]  [place], [place]  [address]  Ph: [phone]  Fax: [phone]    01/27/2019    Patient Care Team:  Tedd Sias, PA as PCP - General  Anice Paganini, MSW (Inactive) as Case Manager/Social Worker (Transplant)  Moises Blood, RN as Transplant Coordinator (Transplant)  [facility] as Consulting Physician (Gynecology)  Christiane Lexine Baton, FNP as Referring Physician (Gastroenterology)  Mack Hook, MD as Consulting Physician (Transplant Hepatology)    RE: Karen Rogers; DOB: May 02, 1966      Reason for visit: Status post OLT    HPI: Karen Rogers is a pleasant 53 year old Caucasian female who is now almost 5 years status post transplant for alpha-1 antitrypsin deficiency induced cirrhosis.   Today's visit is being done via phone due to COVID-19.  She was last seen hepatology clinic on 01/28/2018.   Her post transplant course has been complicated by a biliary anastomotic stricture.  Her last ERCP was on 07/04/2016 with stent being removed.  She also had rejection by biopsy on 04/14/2015 (RAI 6/9), repeat biopsy on 05/24/2015 was negative for rejection.  In regards of her immunosuppression she is currently on mycophenolate 540 mg twice daily and Prograf 3 mg in the a.m. and 2 mg in the p.m.  She is also on entecavir for a total hep B core antibody positive donor.  Tac level remains at 4???6.  She also has history of hepatic artery thrombosis is on aspirin 325 mg daily.  Her last colonoscopy was in 2017.  Patient unfortunately has not had labs done recently.  Her last the labs were from May 2019.  Patient states that she went through divorce and lost her insurance for some time.  She only regained it in the last month or 2.  She has been able to get her meds through [facility].      Today she overall feels well denies any fever, chills, headache, jaundice, chest pain, upper lower GI bleed, melena confusion.        PMH:  Patient Active Problem List   Diagnosis   ??? Alpha-1-antitrypsin deficiency (CMS-HCC)   ??? Asthma   ??? GERD (gastroesophageal reflux disease)   ??? Hypothyroid   ??? Liver transplant 01/30/2014     Past Medical History:   Diagnosis Date   ??? Alpha-1-antitrypsin deficiency (CMS-HCC)    ??? Asthma     better since moving to [place]   ??? Cirrhosis (CMS-HCC) 01/2012    -varices, +ascites, +encephalopathy   ??? History of transfusion    ??? Hypothyroidism    ??? Liver transplant 01/30/2014 02/03/2014   ??? Osteoporosis    ??? Reflux        PSH:  Past Surgical History:   Procedure Laterality Date   ??? CHG SONO GUIDE NEEDLE BIOPSY N/A 05/24/2015    Procedure: ULTRASONIC GUIDANCE NEEDLE BX-RAD S & I;  Surgeon: Joaquin Music, MD;  Location: GI PROCEDURES [facility]IAL [facility];  Service: Gastroenterology   ??? ELBOW FRACTURE SURGERY Right    ??? LIVER TRANSPLANTATION     ??? ORIF HIP FRACTURE Left 2004    hardware in place   ??? PR COLONOSCOPY FLX DX W/COLLJ SPEC WHEN PFRMD N/A 05/08/2016    Procedure: COLONOSCOPY, FLEXIBLE, PROXIMAL TO SPLENIC FLEXURE; DIAGNOSTIC, W/WO COLLECTION SPECIMEN BY BRUSH OR WASH;  Surgeon: Joaquin Music, MD;  Location: GI PROCEDURES MEMORIAL [facility];  Service: Gastroenterology   ??? PR ERCP BALLOON DILATE BILIARY/PANC  DUCT/AMPULLA EA N/A 05/31/2015    Procedure: ERCP;WITH TRANS-ENDOSCOPIC BALLOON DILATION OF BILIARY/PANCREATIC DUCT(S) OR OF AMPULLA, INCLUDING SPHINCTERECTOMY, WHEN PERFOREMD,EACH DUCT (29528);  Surgeon: Malcolm Metro, MD;  Location: GI PROCEDURES MEMORIAL [facility];  Service: Gastroenterology   ??? PR ERCP BILIARY/PANC DUCT STENT EXCHANGE W/DIL&WIRE N/A 05/04/2014    Procedure: ENDOSCOPIC RETROGRADE CHOLANGIOPANCREATOGRAPHY (ERCP); WITH REMOVAL AND EXCHANGE OF STENT(S), BILIARY OR PANCREATIC DUCT;  Surgeon: Malcolm Metro, MD;  Location: GI PROCEDURES MEMORIAL [facility];  Service: Gastroenterology   ??? PR ERCP BILIARY/PANC DUCT STENT EXCHANGE W/DIL&WIRE N/A 06/08/2014    Procedure: ENDOSCOPIC RETROGRADE CHOLANGIOPANCREATOGRAPHY (ERCP); WITH REMOVAL AND EXCHANGE OF STENT(S), BILIARY OR PANCREATIC DUCT;  Surgeon: Malcolm Metro, MD;  Location: GI PROCEDURES [facility];  Service: Gastroenterology   ??? PR ERCP BILIARY/PANC DUCT STENT EXCHANGE W/DIL&WIRE N/A 09/10/2014    Procedure: ENDOSCOPIC RETROGRADE CHOLANGIOPANCREATOGRAPHY (ERCP); WITH REMOVAL AND EXCHANGE OF STENT(S), BILIARY OR PANCREATIC DUCT;  Surgeon: Malcolm Metro, MD;  Location: GI PROCEDURES MEMOR[facility];  Service: Gastroenterology   ??? PR ERCP BILIARY/PANC DUCT STENT EXCHANGE W/DIL&WIRE N/A 09/30/2015    Procedure: ENDOSCOPIC RETROGRADE CHOLANGIOPANCREATOGRAPHY (ERCP); WITH REMOVAL AND EXCHANGE OF STENT(S), BILIARY OR PANCREATIC DUCT;  Surgeon: Chriss Driver, MD;  Location: GI PROCEDURES MEMORIAL [facility];  Service: Gastroenterology   ??? PR ERCP REMOVE FOREIGN BODY/STENT BILIARY/PANC DUCT N/A 02/15/2015    Procedure: ENDOSCOPIC RETROGRADE CHOLANGIOPANCREATOGRAPHY (ERCP); W/ REMOVAL OF FOREIGN BODY/STENT FROM BILIARY/PANCREATIC DUCT(S);  Surgeon: Malcolm Metro, MD;  Location: GI PROCEDURES MEMOR[facility];  Service: Gastroenterology   ??? PR ERCP STENT PLACEMENT BILIARY/PANCREATIC DUCT N/A 02/23/2014    Procedure: ENDOSCOPIC RETROGRADE CHOLANGIOPANCREATOGRAPHY (ERCP); WITH PLACEMENT OF ENDOSCOPIC STENT INTO BILIARY OR PANCREATIC DUCT;  Surgeon: Malcolm Metro, MD;  Location: GI PROCEDURES MEMORIAL [facility];  Service: Gastroenterology   ??? PR ERCP STENT PLACEMENT BILIARY/PANCREATIC DUCT N/A 05/31/2015    Procedure: ENDOSCOPIC RETROGRADE CHOLANGIOPANCREATOGRAPHY (ERCP); WITH PLACEMENT OF ENDOSCOPIC STENT INTO BILIARY OR PANCREATIC DUCT;  Surgeon: Malcolm Metro, MD;  Location: GI PROCEDURES MEMORIAL [facility];  Service: Gastroenterology   ??? PR ERCP,INSERT STENT,BILIARY/PANC N/A 09/27/2014    Procedure: ERCP; W/ENDO RETRO INSRT TUBE/STENT-BILE DUCT;  Surgeon: Mayford Knife, MD;  Location: MAIN OR [facility];  Service: Gastroenterology   ??? PR ERCP,W/REMOVAL STONE,BIL/PANCR DUCTS N/A 02/23/2014    Procedure: ERCP; W/ENDOSCOPIC RETROGRADE REMOVAL OF CALCULUS/CALCULI FROM BILIARY &/OR PANCREATIC DUCTS;  Surgeon: Malcolm Metro, MD;  Location: GI PROCEDURES MEMORIAL [facility];  Service: Gastroenterology   ??? PR ERCP,W/REMOVAL STONE,BIL/PANCR DUCTS N/A 02/15/2015    Procedure: ERCP; W/ENDOSCOPIC RETROGRADE REMOVAL OF CALCULUS/CALCULI FROM BILIARY &/OR PANCREATIC DUCTS;  Surgeon: Malcolm Metro, MD;  Location: GI PROCEDURES [facility];  Service: Gastroenterology   ??? PR ERCP,W/REMOVAL STONE,BIL/PANCR DUCTS N/A 05/31/2015    Procedure: ERCP; W/ENDOSCOPIC RETROGRADE REMOVAL OF CALCULUS/CALCULI FROM BILIARY &/OR PANCREATIC DUCTS;  Surgeon: Malcolm Metro, MD;  Location: GI PROCEDURES [facility]ORIAL [facility];  Service: Gastroenterology   ??? PR ERCP,W/REMOVAL STONE,BIL/PANCR DUCTS N/A 09/30/2015    Procedure: ERCP; W/ENDOSCOPIC RETROGRADE REMOVAL OF CALCULUS/CALCULI FROM BILIARY &/OR PANCREATIC DUCTS;  Surgeon: Chriss Driver, MD;  Location: GI PROCEDURES MEMORIAL [facility];  Service: Gastroenterology   ??? PR ERCP,W/REMOVAL STONE,BIL/PANCR DUCTS N/A 07/04/2016    Procedure: ERCP; W/ENDOSCOPIC RETROGRADE REMOVAL OF CALCULUS/CALCULI FROM BILIARY &/OR PANCREATIC DUCTS;  Surgeon: Chriss Driver, MD;  Location: GI PROCEDURES MEMORIAL [facility];  Service: Gastroenterology   ??? PR NEEDLE BIOPSY LIVER N/A 04/14/2015    Procedure: BX LIVER NEEDLE; [id number];  Surgeon: Janyth Pupa, MD;  Location: GI PROCEDURES [facility];  Service: Gastroenterology   ??? PR NEEDLE BIOPSY LIVER N/A 05/24/2015    Procedure: BX LIVER NEEDLE; [id number];  Surgeon: Joaquin Music, MD;  Location: GI PROCEDURES [facility];  Service: Gastroenterology   ??? PR PERQ DRAINAGE PLEURA INSERT CATH W/IMAGING Right 01/22/2014    Procedure: PLEURAL DRAINAGE, PERC, W INSERTION OF INDWELLING CATHETER; W IMAGING GUIDANCE;  Surgeon: Mathis Bud, MD;  Location: BRONCH PROCEDURE LAB [facility];  Service: Pulmonary   ??? PR TRANSPLANT LIVER,ALLOTRANSPLANT Midline 01/30/2014    Procedure: LIVER ALLOTRANSPLANTATION; ORTHOTOPIC, PARTIAL OR WHOLE, FROM CADAVER OR LIVING DONOR, ANY AGE;  Surgeon: Vivi Barrack, MD;  Location: MAIN OR [facility];  Service: Transplant       MEDICATIONS:  Current Outpatient Medications   Medication Sig Dispense Refill   ??? acetaminophen (TYLENOL) 325 MG tablet Take 2 tablets (650 mg total) by mouth every eight (8) hours as needed for pain. 30 tablet 0   ??? aspirin 325 MG tablet Take 325 mg by mouth daily.     ??? calcium carbonate (CALCIUM 600) 600 mg calcium (1,500 mg) tablet Take 1 tablet by mouth daily.     ??? cholecalciferol, vitamin D3, 2,000 unit cap Take 1 capsule (2,000 Units total) by mouth daily. 90 capsule 3   ??? entecavir (BARACLUDE) 0.5 MG tablet TAKE 1 TABLET BY MOUTH ONCE DAILY 90 each 3   ??? fluticasone propion-salmeterol (ADVAIR DISKUS) 100-50 mcg/dose diskus Inhale. Only takes in the winter     ??? hydrOXYzine (ATARAX) 25 MG tablet Take 25 mg by mouth daily.     ??? levalbuterol (XOPENEX HFA) 45 mcg/actuation inhaler Inhale 2 puffs. As needed     ??? levothyroxine (SYNTHROID, LEVOTHROID) 125 MCG tablet Take 125 mcg by mouth daily at 0600.     ??? mycophenolate (MYFORTIC) 180 MG EC tablet Take 3 tablets (540 mg total) by mouth Two (2) times a day. 540 tablet 3   ??? OMEGA-3/DHA/EPA/FISH OIL (FISH OIL-OMEGA-3 FATTY ACIDS) 300-1,000 mg capsule Take 1,200 mg by mouth daily.     ??? omeprazole (PRILOSEC) 20 MG capsule Take 20 mg by mouth daily. 4/10: pt takes omep 20 every other day     ??? PROGRAF 1 mg capsule TAKE 3 CAPSULES (3 MG) BY MOUTH IN THE MORNING AND TAKE 2 CAPSULES (2 MG) IN THE EVENING 450 capsule 3     No current facility-administered medications for this visit.        ALLERGIES:  Fosamax [alendronate]; Other; and Furosemide        SH: Quit tobacco in 2013.  No alcohol use      RoS: Negative on 10 systems review other than what is mentioned above.      LABS:  Computed MELD-Na score unavailable. Necessary lab results were not found in the last year.  Computed MELD score unavailable. Necessary lab results were not found in the last year.    All lab results last 72 hours:    No results found for this or any previous visit (from the past 72 hour(s)).      ASSESSMENT:  Karen Rogers is a pleasant 53 year old Caucasian female who is now almost 5 years status post transplant for alpha-1 antitrypsin deficiency induced cirrhosis.   Today's visit is being done via phone due to COVID-19.  She was last seen hepatology clinic on 01/28/2018.   Her post transplant course has been complicated by a biliary anastomotic stricture.  Her last ERCP was on 07/04/2016 with stent  being removed.  She also had rejection by biopsy on 04/14/2015 (RAI 6/9), repeat biopsy on 05/24/2015 was negative for rejection.  In regards of her immunosuppression she is currently on mycophenolate 540 mg twice daily and Prograf 3 mg in the a.m. and 2 mg in the p.m.  She is also on entecavir for a total hep B core antibody positive donor.  Tac level remains at 4???6.  She also has history of hepatic artery thrombosis is on aspirin 325 mg daily.  Her last colonoscopy was in 2017.  Patient unfortunately has not had labs done recently.  Her last the labs were from May 2019.  Patient states that she went through divorce and lost her insurance for some time.  She only regained it in the last month or 2.  She has been able to get her meds through [facility].                PLAN:  1. I have discussed her care with Dr. Pervis Hocking  2.  Check laboratory studies.  Patient will go to [facility] this week.  3.  Patient's liver ultrasound and chest x-ray have been rescheduled for August 2020 due to COVID-19.  4.  Continue all medications as directed.  5.  Recommend annual skin surveillance with dermatology.  Patient will arrange locally.  6.  Plan to see Karen Rogers back to liver clinic in 1 year.      I spent 13 minutes on the phone with the patient. I spent an additional 5 minutes on pre- and post-visit activities.     The patient was physically located in [place] or a state in which I am permitted to provide care. The patient and/or parent/gauardian understood that s/he may incur co-pays and cost sharing, and agreed to the telemedicine visit. The visit was completed via phone and/or video, which was appropriate and reasonable under the circumstances given the patient's presentation at the time.    The patient and/or parent/guardian has been advised of the potential risks and limitations of this mode of treatment (including, but not limited to, the absence of in-person examination) and has agreed to be treated using telemedicine. The patient's/patient's family's questions regarding telemedicine have been answered.     If the phone/video visit was completed in an ambulatory setting, the patient and/or parent/guardian has also been advised to contact their provider???s office for worsening conditions, and seek emergency medical treatment and/or call 911 if the patient deems either necessary.        Tina Griffiths, PA-C  [facility]  [address]  [place], [place]  [address]  Ph: [phone]  Fax: [phone]

## 2019-02-10 NOTE — Unmapped (Signed)
Received call from patient's PCP noting interest to begin patient on lipitor for elevated cholesterol - confirmed providers @ [facility] agreeable with interest for patient to repeat routine post liver txp labs (as recently reinforced during 5/26 virtual visit) for surveillance during initiation - RN @ PCP office confirms they will reinforce this patient patient.  She was uncertain what dosing of lipitor provider will order thus coordinator did not add lipitor to medication list at this time in Epic.

## 2019-03-10 LAB — CBC W/ DIFFERENTIAL
BANDED NEUTROPHILS ABSOLUTE COUNT: 0 10*3/uL (ref 0.0–0.1)
EOSINOPHILS ABSOLUTE COUNT: 0.2 10*3/uL (ref 0.0–0.4)
EOSINOPHILS RELATIVE PERCENT: 4 %
HEMATOCRIT: 48.4 % — ABNORMAL HIGH (ref 34.0–46.6)
HEMOGLOBIN: 15.9 g/dL (ref 11.1–15.9)
IMMATURE GRANULOCYTES: 0 %
LYMPHOCYTES ABSOLUTE COUNT: 1.4 10*3/uL (ref 0.7–3.1)
LYMPHOCYTES RELATIVE PERCENT: 23 %
MEAN CORPUSCULAR HEMOGLOBIN CONC: 32.9 g/dL (ref 31.5–35.7)
MEAN CORPUSCULAR HEMOGLOBIN: 29.6 pg (ref 26.6–33.0)
MEAN CORPUSCULAR VOLUME: 90 fL (ref 79–97)
MONOCYTES ABSOLUTE COUNT: 0.6 10*3/uL (ref 0.1–0.9)
MONOCYTES RELATIVE PERCENT: 9 %
NEUTROPHILS ABSOLUTE COUNT: 3.8 10*3/uL (ref 1.4–7.0)
NEUTROPHILS RELATIVE PERCENT: 63 %
PLATELET COUNT: 158 10*3/uL (ref 150–450)
RED CELL DISTRIBUTION WIDTH: 13.5 % (ref 11.7–15.4)
WHITE BLOOD CELL COUNT: 6.1 10*3/uL (ref 3.4–10.8)

## 2019-03-10 LAB — COMPREHENSIVE METABOLIC PANEL
A/G RATIO: 1.5 (ref 1.2–2.2)
ALBUMIN: 4.5 g/dL (ref 3.8–4.9)
ALKALINE PHOSPHATASE: 110 IU/L (ref 39–117)
ALT (SGPT): 18 IU/L (ref 0–32)
AST (SGOT): 22 IU/L (ref 0–40)
BILIRUBIN TOTAL: 0.6 mg/dL (ref 0.0–1.2)
BLOOD UREA NITROGEN: 17 mg/dL (ref 6–24)
CALCIUM: 9.5 mg/dL (ref 8.7–10.2)
CHLORIDE: 101 mmol/L (ref 96–106)
CREATININE: 0.89 mg/dL (ref 0.57–1.00)
GLOBULIN, TOTAL: 3.1 g/dL (ref 1.5–4.5)
GLUCOSE: 102 mg/dL — ABNORMAL HIGH (ref 65–99)
POTASSIUM: 4.8 mmol/L (ref 3.5–5.2)
SODIUM: 141 mmol/L (ref 134–144)
TOTAL PROTEIN: 7.6 g/dL (ref 6.0–8.5)

## 2019-03-10 LAB — MAGNESIUM: Magnesium:MCnc:Pt:Ser/Plas:Qn:: 1.8

## 2019-03-10 LAB — GAMMA GLUTAMYL TRANSFERASE: Gamma glutamyl transferase:CCnc:Pt:Ser/Plas:Qn:: 58

## 2019-03-10 LAB — BILIRUBIN DIRECT: Bilirubin.glucuronidated+Bilirubin.albumin bound:MCnc:Pt:Ser/Plas:Qn:: 0.14

## 2019-03-10 LAB — GLUCOSE: Glucose:MCnc:Pt:Ser/Plas:Qn:: 102 — ABNORMAL HIGH

## 2019-03-10 LAB — PHOSPHORUS, SERUM: Phosphate:MCnc:Pt:Ser/Plas:Qn:: 3.2

## 2019-03-10 LAB — GAMMA GT: GAMMA GLUTAMYL TRANSFERASE: 58 IU/L (ref 0–60)

## 2019-03-10 LAB — WHITE BLOOD CELL COUNT: Leukocytes:NCnc:Pt:Bld:Qn:Automated count: 6.1

## 2019-03-11 LAB — TACROLIMUS BLOOD: Tacrolimus:MCnc:Pt:Bld:Qn:LC/MS/MS: 6.4

## 2019-03-11 NOTE — Unmapped (Signed)
Tac level on 7/6 labs just above goal of 4-6 - given infrequency of lab draws (last in 2019) and h/o rejection no adjustments will be made to dosing at this time.  Will reassess potential to reduce dosing with future labs to confirm stability.

## 2019-04-15 NOTE — Unmapped (Signed)
Received call from Dr. Cathey Endow office asking to speak with nurse coordinator to start patient on a medication. Informed nurse primary coordinator is out of office so I will forward request to other post transplant coordinator. Nurse requested a call at [phone].

## 2019-04-15 NOTE — Unmapped (Signed)
Received notification from tpa that Dr. Cathey Endow was requesting call regarding medication question. Called back Dr. Cathey Endow @ [facility]  clinic and she wanted to know if pt can take phentermine 15 mg. Consulted Cleone Slim PharmD who authorized use. Called back and and relayed auth to use medication to Dr. Cathey Endow.

## 2019-04-21 NOTE — Unmapped (Signed)
[facility] Clinical Assessment & Refill Coordination Note    Karen Rogers, DOB: 03-04-1966  Phone: [phone] (home)     All above HIPAA information was verified with patient.     Specialty Medication(s):   Transplant:  mycophenolic acid 180mg , Prograf 1mg  and Entecavir 0.5mg      Current Outpatient Medications   Medication Sig Dispense Refill   ??? acetaminophen (TYLENOL) 325 MG tablet Take 2 tablets (650 mg total) by mouth every eight (8) hours as needed for pain. 30 tablet 0   ??? aspirin 325 MG tablet Take 325 mg by mouth daily.     ??? calcium carbonate (CALCIUM 600) 600 mg calcium (1,500 mg) tablet Take 1 tablet by mouth daily.     ??? cholecalciferol, vitamin D3, 2,000 unit cap Take 1 capsule (2,000 Units total) by mouth daily. 90 capsule 3   ??? entecavir (BARACLUDE) 0.5 MG tablet TAKE 1 TABLET BY MOUTH ONCE DAILY 90 each 3   ??? fluticasone propion-salmeterol (ADVAIR DISKUS) 100-50 mcg/dose diskus Inhale. Only takes in the winter     ??? hydrOXYzine (ATARAX) 25 MG tablet Take 25 mg by mouth daily.     ??? levalbuterol (XOPENEX HFA) 45 mcg/actuation inhaler Inhale 2 puffs. As needed     ??? levothyroxine (SYNTHROID, LEVOTHROID) 125 MCG tablet Take 125 mcg by mouth daily at 0600.     ??? mycophenolate (MYFORTIC) 180 MG EC tablet Take 3 tablets (540 mg total) by mouth Two (2) times a day. 540 tablet 3   ??? OMEGA-3/DHA/EPA/FISH OIL (FISH OIL-OMEGA-3 FATTY ACIDS) 300-1,000 mg capsule Take 1,200 mg by mouth daily.     ??? omeprazole (PRILOSEC) 20 MG capsule Take 20 mg by mouth daily. 4/10: pt takes omep 20 every other day     ??? phentermine 15 MG capsule Take 15 mg by mouth every morning.     ??? PROGRAF 1 mg capsule TAKE 3 CAPSULES (3 MG) BY MOUTH IN THE MORNING AND TAKE 2 CAPSULES (2 MG) IN THE EVENING 450 capsule 3     No current facility-administered medications for this visit.         Changes to medications: Desaray reports no changes at this time.    Allergies   Allergen Reactions   ??? Fosamax [Alendronate]      Itchy throat and rash   ??? Other      Wasp   sting   ??? Furosemide      Other reaction(s): Other (See Comments)  Abdominal pain, fever.  Trouble breathing, severe abdominal pain after 1 week       Changes to allergies: No    SPECIALTY MEDICATION ADHERENCE     Entecavir 0.5 mg: 22 days of medicine on hand   Mycophenolate 180 mg: 21 days of medicine on hand   Prograf 1 mg: 21 days of medicine on hand     Medication Adherence    Patient reported X missed doses in the last month: 3  Specialty Medication: mycophenolate 180mg   Patient is on additional specialty medications: Yes  Additional Specialty Medications: Prograf 1mg   Patient Reported Additional Medication X Missed Doses in the Last Month: 3  Patient is on more than two specialty medications: Yes  Specialty Medication: Entecavir 0.5mg   Patient Reported Additional Medication X Missed Doses in the Last Month: 3  Adherence tools used: cell phone          Specialty medication(s) dose(s) confirmed: Regimen is correct and unchanged.  Are there any concerns with adherence? No    Adherence counseling provided? Not needed    CLINICAL MANAGEMENT AND INTERVENTION      Clinical Benefit Assessment:    Do you feel the medicine is effective or helping your condition? Yes    Clinical Benefit counseling provided? Not needed    Adverse Effects Assessment:    Are you experiencing any side effects? No    Are you experiencing difficulty administering your medicine? No    Quality of Life Assessment:    How many days over the past month did your liver transplant  keep you from your normal activities? For example, brushing your teeth or getting up in the morning. 0    Have you discussed this with your provider? Not needed    Therapy Appropriateness:    Is therapy appropriate? Yes, therapy is appropriate and should be continued    DISEASE/MEDICATION-SPECIFIC INFORMATION      N/A    PATIENT SPECIFIC NEEDS     ? Does the patient have any physical, cognitive, or cultural barriers? No    ? Is the patient high risk? Yes, patient taking a REMS drug     ? Does the patient require a Care Management Plan? No     ? Does the patient require physician intervention or other additional services (i.e. nutrition, smoking cessation, social work)? No      SHIPPING     Specialty Medication(s) to be Shipped:   Transplant: Patient states she has over 3 weeks of medicine on hand and would like a call back in about 3 weks.    Other medication(s) to be shipped: none     Changes to insurance: No    Delivery Scheduled: Patient declined refill at this time due to Patient has over 3 weeks of medicine on hand and decline needing anything at this time..     Medication will be delivered via UPS to the confirmed home address in [facility].    The patient will receive a drug information handout for each medication shipped and additional FDA Medication Guides as required.  Verified that patient has previously received a Conservation officer, historic buildings.    All of the patient's questions and concerns have been addressed.    Tera Helper   [facility] Specialty Pharmacist

## 2019-04-27 ENCOUNTER — Encounter: Admit: 2019-04-27 | Discharge: 2019-04-27 | Payer: TRICARE (CHAMPUS)

## 2019-04-27 DIAGNOSIS — Z944 Liver transplant status: Principal | ICD-10-CM

## 2019-04-27 DIAGNOSIS — Z79899 Other long term (current) drug therapy: Secondary | ICD-10-CM

## 2019-04-27 DIAGNOSIS — B169 Acute hepatitis B without delta-agent and without hepatic coma: Secondary | ICD-10-CM

## 2019-04-27 LAB — CBC W/ AUTO DIFF
BASOPHILS ABSOLUTE COUNT: 0 10*9/L (ref 0.0–0.1)
BASOPHILS RELATIVE PERCENT: 0.6 %
EOSINOPHILS ABSOLUTE COUNT: 0.3 10*9/L (ref 0.0–0.4)
EOSINOPHILS RELATIVE PERCENT: 5.1 %
HEMATOCRIT: 48.6 % — ABNORMAL HIGH (ref 36.0–46.0)
HEMOGLOBIN: 15.3 g/dL (ref 12.0–16.0)
LARGE UNSTAINED CELLS: 2 % (ref 0–4)
LYMPHOCYTES ABSOLUTE COUNT: 1.1 10*9/L — ABNORMAL LOW (ref 1.5–5.0)
LYMPHOCYTES RELATIVE PERCENT: 22.5 %
MEAN CORPUSCULAR HEMOGLOBIN CONC: 31.6 g/dL (ref 31.0–37.0)
MEAN CORPUSCULAR HEMOGLOBIN: 29.3 pg (ref 26.0–34.0)
MEAN CORPUSCULAR VOLUME: 92.8 fL (ref 80.0–100.0)
MONOCYTES ABSOLUTE COUNT: 0.4 10*9/L (ref 0.2–0.8)
MONOCYTES RELATIVE PERCENT: 8.1 %
NEUTROPHILS ABSOLUTE COUNT: 3 10*9/L (ref 2.0–7.5)
NEUTROPHILS RELATIVE PERCENT: 61.6 %
PLATELET COUNT: 124 10*9/L — ABNORMAL LOW (ref 150–440)
RED BLOOD CELL COUNT: 5.24 10*12/L — ABNORMAL HIGH (ref 4.00–5.20)
RED CELL DISTRIBUTION WIDTH: 13.4 % (ref 12.0–15.0)

## 2019-04-27 LAB — COMPREHENSIVE METABOLIC PANEL
ALBUMIN: 4.1 g/dL (ref 3.5–5.0)
ALKALINE PHOSPHATASE: 119 U/L (ref 38–126)
ALT (SGPT): 25 U/L (ref <35)
ANION GAP: 7 mmol/L (ref 7–15)
AST (SGOT): 31 U/L (ref 17–47)
BLOOD UREA NITROGEN: 23 mg/dL — ABNORMAL HIGH (ref 7–21)
BUN / CREAT RATIO: 33
CALCIUM: 9.5 mg/dL (ref 8.5–10.2)
CHLORIDE: 105 mmol/L (ref 98–107)
CO2: 30 mmol/L (ref 22.0–30.0)
EGFR CKD-EPI AA FEMALE: >90 mL/min/{1.73_m2} (ref >=60)
EGFR CKD-EPI NON-AA FEMALE: >90 mL/min/{1.73_m2} (ref >=60)
GLUCOSE RANDOM: 102 mg/dL (ref 70–179)
POTASSIUM: 4.5 mmol/L (ref 3.5–5.0)
PROTEIN TOTAL: 7.2 g/dL (ref 6.5–8.3)
SODIUM: 142 mmol/L (ref 135–145)

## 2019-04-27 LAB — TACROLIMUS BLOOD: Lab: 6.7

## 2019-04-27 LAB — LYMPHOCYTES RELATIVE PERCENT: Lab: 22.5

## 2019-04-27 LAB — MAGNESIUM: Magnesium:MCnc:Pt:Ser/Plas:Qn:: 1.5 — ABNORMAL LOW

## 2019-04-27 LAB — HEPATITIS B SURFACE ANTIGEN: Hepatitis B virus surface Ag:PrThr:Pt:Ser:Ord:: NONREACTIVE

## 2019-04-27 LAB — GAMMA GLUTAMYL TRANSFERASE: Gamma glutamyl transferase:CCnc:Pt:Ser/Plas:Qn:: 70 — ABNORMAL HIGH

## 2019-04-27 LAB — BILIRUBIN DIRECT: Bilirubin.glucuronidated+Bilirubin.albumin bound:MCnc:Pt:Ser/Plas:Qn:: 0.1

## 2019-04-27 LAB — ALT (SGPT): Alanine aminotransferase:CCnc:Pt:Ser/Plas:Qn:: 25

## 2019-04-27 LAB — PHOSPHORUS: Phosphate:MCnc:Pt:Ser/Plas:Qn:: 3.5

## 2019-04-27 NOTE — Unmapped (Addendum)
04/27/19 - Reviewed patient's 8/24 tac level of 6.7 (goal 4-6) with Tina Griffiths, PA who orders patient to reduce dosing from 3/2 to 2mg  BID.  Call placed to patient to relay dosing reduction.      04/28/19 - Second attempt to reach patient to discuss dosing reduction - left vm requesting return call.    04/29/19 - Patient returned call - relayed dosing reduction to 2mg  BID - patient verbalized understanding.  Encouraged repeat labs in 2-4 weeks to ensure therapeutic trough - patient verbalized agreement.

## 2019-04-27 NOTE — Unmapped (Signed)
Patient's annual imaging including: chest xray and liver ultrasound, reviewed by Georgina Quint, PA via inbasket with no recommendation for follow-up other than routine annual imaging per protocol.

## 2019-04-29 LAB — HBV DNA QUANT: Hepatitis B virus DNA:PrThr:Pt:Bld:Ord:Probe.amp.tar: NOT DETECTED

## 2019-04-29 LAB — HEPATITIS B DNA, ULTRAQUANTITATIVE, PCR

## 2019-04-29 MED ORDER — PROGRAF 1 MG CAPSULE
ORAL_CAPSULE | Freq: Two times a day (BID) | ORAL | 3 refills | 90 days | Status: CP
Start: 2019-04-29 — End: 2020-04-28

## 2019-04-29 NOTE — Unmapped (Signed)
Prograf dose change  Copay = $0  Last filled 01/22/19 (90 day supply)

## 2019-05-12 DIAGNOSIS — Z944 Liver transplant status: Secondary | ICD-10-CM

## 2019-05-12 DIAGNOSIS — B181 Chronic viral hepatitis B without delta-agent: Secondary | ICD-10-CM

## 2019-05-12 MED ORDER — ENTECAVIR 0.5 MG TABLET: each | 3 refills | 0 days | Status: AC

## 2019-05-12 MED ORDER — ENTECAVIR 0.5 MG TABLET
Freq: Every day | ORAL | 3 refills | 0.00000 days | Status: CP
Start: 2019-05-12 — End: 2019-05-12
  Filled 2019-05-14: qty 90, 90d supply, fill #0

## 2019-05-12 NOTE — Unmapped (Signed)
Pt request for RX Refill

## 2019-05-12 NOTE — Unmapped (Signed)
[facility] Refill Coordination Note    Specialty Medication(s) to be Shipped:   Transplant: Entecavir,  mycophenolic acid 180mg  and Prograf 1mg     Other medication(s) to be shipped: none     Karen Rogers, DOB: 12/29/1965  Phone: [phone] (home)       All above HIPAA information was verified with patient.     Completed refill call assessment today to schedule patient's medication shipment from the [facility] [phone]).       Specialty medication(s) and dose(s) confirmed: Regimen is correct and unchanged.   Changes to medications: Karen Rogers reports no changes at this time.  Changes to insurance: No  Questions for the pharmacist: No    Confirmed patient received Welcome Packet with first shipment. The patient will receive a drug information handout for each medication shipped and additional FDA Medication Guides as required.       DISEASE/MEDICATION-SPECIFIC INFORMATION        N/A    SPECIALTY MEDICATION ADHERENCE     Medication Adherence    Patient reported X missed doses in the last month: 0  Adherence tools used: cell phone            Mycophenolic acid 180mg : Patient has 8 days worth of tablets on hand.   Prograf 1mg : Patient has 10 days worth of capsules on hand.   Entecavir 0.5mg : Patient has 8 days worth of on hand.        SHIPPING     Shipping address confirmed in Epic.     Delivery Scheduled: Yes, Expected medication delivery date: 05/14/19.     Medication will be delivered via UPS to the home address in Epic WAM.    Karen Rogers   [facility] Specialty Technician

## 2019-05-13 DIAGNOSIS — Z944 Liver transplant status: Secondary | ICD-10-CM

## 2019-05-13 DIAGNOSIS — Z79899 Other long term (current) drug therapy: Secondary | ICD-10-CM

## 2019-05-13 MED ORDER — TACROLIMUS 1 MG CAPSULE
ORAL_CAPSULE | Freq: Two times a day (BID) | ORAL | 3 refills | 90 days | Status: CP
Start: 2019-05-13 — End: 2020-05-12
  Filled 2019-05-14: qty 360, 90d supply, fill #0

## 2019-05-13 NOTE — Unmapped (Signed)
Spoke to Ms. Farino about changing from Prograf to generic tacrolimus due to insurance restrictions.   Told her to give the clinic a call when she starts the new mfg to schedule labs.  Pt acknowledged understanding

## 2019-05-13 NOTE — Unmapped (Signed)
Sohana Frericks 's MYCOPHENOLATE, ENTACVIER & TACROLIMUS shipment will be delayed as a result of INSURANCE BEING DOWN    I have reached out to the patient and communicated the delivery change. We will reschedule the medication for the delivery date that the patient agreed upon.  We have confirmed the delivery date as 9/11

## 2019-05-14 MED FILL — MYCOPHENOLATE SODIUM 180 MG TABLET,DELAYED RELEASE: ORAL | 90 days supply | Qty: 540 | Fill #3

## 2019-05-14 MED FILL — ENTECAVIR 0.5 MG TABLET: 90 days supply | Qty: 90 | Fill #0 | Status: AC

## 2019-05-14 MED FILL — MYCOPHENOLATE SODIUM 180 MG TABLET,DELAYED RELEASE: 90 days supply | Qty: 540 | Fill #3 | Status: AC

## 2019-05-14 MED FILL — TACROLIMUS 1 MG CAPSULE: 90 days supply | Qty: 360 | Fill #0 | Status: AC

## 2019-07-29 NOTE — Unmapped (Signed)
[facility] Clinical Assessment & Refill Coordination Note    Karen Rogers, DOB: 06/19/1966  Phone: [phone] (home)     All above HIPAA information was verified with patient.     Specialty Medication(s):   Transplant:  mycophenolic acid 180mg , tacrolimus 1mg  and entecavir 0.5mg      Current Outpatient Medications   Medication Sig Dispense Refill   ??? acetaminophen (TYLENOL) 325 MG tablet Take 2 tablets (650 mg total) by mouth every eight (8) hours as needed for pain. 30 tablet 0   ??? aspirin 325 MG tablet Take 325 mg by mouth daily.     ??? calcium carbonate (CALCIUM 600) 600 mg calcium (1,500 mg) tablet Take 1 tablet by mouth daily.     ??? cholecalciferol, vitamin D3, 2,000 unit cap Take 1 capsule (2,000 Units total) by mouth daily. 90 capsule 3   ??? entecavir (BARACLUDE) 0.5 MG tablet Take 1 tablet (0.5 mg total) by mouth daily. 90 each 3   ??? fluticasone propion-salmeterol (ADVAIR DISKUS) 100-50 mcg/dose diskus Inhale. Only takes in the winter     ??? hydrOXYzine (ATARAX) 25 MG tablet Take 25 mg by mouth daily.     ??? levalbuterol (XOPENEX HFA) 45 mcg/actuation inhaler Inhale 2 puffs. As needed     ??? levothyroxine (SYNTHROID, LEVOTHROID) 125 MCG tablet Take 125 mcg by mouth daily at 0600.     ??? mycophenolate (MYFORTIC) 180 MG EC tablet Take 3 tablets (540 mg total) by mouth Two (2) times a day. 540 tablet 3   ??? OMEGA-3/DHA/EPA/FISH OIL (FISH OIL-OMEGA-3 FATTY ACIDS) 300-1,000 mg capsule Take 1,200 mg by mouth daily.     ??? omeprazole (PRILOSEC) 20 MG capsule Take 20 mg by mouth daily. 4/10: pt takes omep 20 every other day     ??? phentermine 15 MG capsule Take 15 mg by mouth every morning.     ??? tacrolimus (PROGRAF) 1 MG capsule Take 2 capsules (2 mg total) by mouth two (2) times a day. 360 capsule 3     No current facility-administered medications for this visit.         Changes to medications: Karen Rogers reports no changes at this time.    Allergies   Allergen Reactions ??? Fosamax [Alendronate]      Itchy throat and rash   ??? Other      Wasp   sting   ??? Furosemide      Other reaction(s): Other (See Comments)  Abdominal pain, fever.  Trouble breathing, severe abdominal pain after 1 week       Changes to allergies: No    SPECIALTY MEDICATION ADHERENCE     entecavir 0.5mg   : 3 months of medicine on hand   Mycophenolate 180mg   : 3 months of medicine on hand   Tacrolimus 1mg   : 3 months of medicine on hand     Medication Adherence    Patient reported X missed doses in the last month: 0  Specialty Medication: entecavir 0.5mg   Patient is on additional specialty medications: Yes  Additional Specialty Medications: Mycophenolate 180mg   Patient Reported Additional Medication X Missed Doses in the Last Month: 0  Patient is on more than two specialty medications: Yes  Specialty Medication: tacrolimus 1mg   Patient Reported Additional Medication X Missed Doses in the Last Month: 0  Adherence tools used: cell phone          Specialty medication(s) dose(s) confirmed: Regimen is correct and unchanged.     Are there  any concerns with adherence? No    Adherence counseling provided? Not needed    CLINICAL MANAGEMENT AND INTERVENTION      Clinical Benefit Assessment:    Do you feel the medicine is effective or helping your condition? Yes    Clinical Benefit counseling provided? Not needed    Adverse Effects Assessment:    Are you experiencing any side effects? No    Are you experiencing difficulty administering your medicine? No    Quality of Life Assessment:    How many days over the past month did your transplant  keep you from your normal activities? For example, brushing your teeth or getting up in the morning. 0    Have you discussed this with your provider? Not needed    Therapy Appropriateness:    Is therapy appropriate? Yes, therapy is appropriate and should be continued    DISEASE/MEDICATION-SPECIFIC INFORMATION      N/A    PATIENT SPECIFIC NEEDS ? Does the patient have any physical, cognitive, or cultural barriers? No    ? Is the patient high risk? Yes, patient taking a REMS drug     ? Does the patient require a Care Management Plan? No     ? Does the patient require physician intervention or other additional services (i.e. nutrition, smoking cessation, social work)? No      SHIPPING     Specialty Medication(s) to be Shipped:   na    Other medication(s) to be shipped: na     Changes to insurance: No    Delivery Scheduled: Patient declined refill at this time due to pt states she has not yet opened our last shipment sent to her in Sept. She still has over 3 months worth of all meds on hand and wants [place] to call back in 2.5 months to check supply at that time. she is aware to call us if less than 1 week on any med..     Medication will be delivered via na to the confirmed na address in [facility].    The patient will receive a drug information handout for each medication shipped and additional FDA Medication Guides as required.  Verified that patient has previously received a Conservation officer, historic buildings.    All of the patient's questions and concerns have been addressed.    Thad Ranger   [facility] Specialty Pharmacist

## 2019-08-12 MED ORDER — MYCOPHENOLATE SODIUM 180 MG TABLET,DELAYED RELEASE
ORAL_TABLET | Freq: Two times a day (BID) | ORAL | 3 refills | 90 days | Status: CP
Start: 2019-08-12 — End: ?
  Filled 2019-10-13: qty 540, 90d supply, fill #0

## 2019-10-12 DIAGNOSIS — Z944 Liver transplant status: Principal | ICD-10-CM

## 2019-10-12 NOTE — Unmapped (Signed)
[facility] Refill Coordination Note    Specialty Medication(s) to be Shipped:   Transplant: Entecavir, mycophenolate mofetil 180mg  and tacrolimus 1mg    **Sent rf request for mycophenolate 180mg **    Other medication(s) to be shipped: N/A     Karen Rogers, DOB: 01/10/1966  Phone: [phone] (home)       All above HIPAA information was verified with patient.     Was a Nurse, learning disability used for this call? No    Completed refill call assessment today to schedule patient's medication shipment from the [facility] [phone]).       Specialty medication(s) and dose(s) confirmed: Regimen is correct and unchanged.   Changes to medications: Karen Rogers reports no changes at this time.  Changes to insurance: No  Questions for the pharmacist: No    Confirmed patient received Welcome Packet with first shipment. The patient will receive a drug information handout for each medication shipped and additional FDA Medication Guides as required.       DISEASE/MEDICATION-SPECIFIC INFORMATION        N/A    SPECIALTY MEDICATION ADHERENCE     Medication Adherence    Patient reported X missed doses in the last month: 4  Specialty Medication: Entecavir 0.5mg   Patient is on additional specialty medications: Yes  Additional Specialty Medications: Mycophenolate 180mg   Patient Reported Additional Medication X Missed Doses in the Last Month: 4  Patient is on more than two specialty medications: Yes  Specialty Medication: Tacrolimus 1mg   Patient Reported Additional Medication X Missed Doses in the Last Month: 4  Adherence tools used: cell phone          Entecavir 0.5 mg: 5 days of medicine on hand   Mycophenolate 180 mg: 5 days of medicine on hand   Tacrolimus 1 mg: 5 days of medicine on hand     SHIPPING     Shipping address confirmed in Epic.     Delivery Scheduled: Yes, Expected medication delivery date: 10/14/2019.     Medication will be delivered via UPS to the prescription address in Epic WAM.    Karen Rogers [facility] Specialty Technician

## 2019-10-13 MED FILL — ENTECAVIR 0.5 MG TABLET: 90 days supply | Qty: 90 | Fill #1 | Status: AC

## 2019-10-13 MED FILL — TACROLIMUS 1 MG CAPSULE: 30 days supply | Qty: 120 | Fill #1 | Status: AC

## 2019-10-13 MED FILL — TACROLIMUS 1 MG CAPSULE, IMMEDIATE-RELEASE: ORAL | 30 days supply | Qty: 120 | Fill #1

## 2019-10-13 MED FILL — ENTECAVIR 0.5 MG TABLET: ORAL | 90 days supply | Qty: 90 | Fill #1

## 2019-10-13 MED FILL — MYCOPHENOLATE SODIUM 180 MG TABLET,DELAYED RELEASE: 90 days supply | Qty: 540 | Fill #0 | Status: AC

## 2019-11-04 NOTE — Unmapped (Signed)
[facility] Refill Coordination Note    Specialty Medication(s) to be Shipped:   Transplant: tacrolimus 1mg     Other medication(s) to be shipped: N/A     Karen Rogers, DOB: Jan 07, 1966  Phone: [phone] (home)       All above HIPAA information was verified with patient.     Was a Nurse, learning disability used for this call? No    Completed refill call assessment today to schedule patient's medication shipment from the [facility] [phone]).       Specialty medication(s) and dose(s) confirmed: Regimen is correct and unchanged.   Changes to medications: Karen Rogers reports no changes at this time.  Changes to insurance: No  Questions for the pharmacist: No    Confirmed patient received Welcome Packet with first shipment. The patient will receive a drug information handout for each medication shipped and additional FDA Medication Guides as required.       DISEASE/MEDICATION-SPECIFIC INFORMATION        N/A    SPECIALTY MEDICATION ADHERENCE     Medication Adherence    Patient reported X missed doses in the last month: 3  Specialty Medication: Tacrolimus 1mg   Patient is on additional specialty medications: No  Adherence tools used: cell phone          Tacrolimus 1 mg: 7 days of medicine on hand     SHIPPING     Shipping address confirmed in Epic.     Delivery Scheduled: Yes, Expected medication delivery date: 11/11/2019.     Medication will be delivered via UPS to the prescription address in Epic WAM.    Karen Rogers [facility] [place] [facility] Specialty Technician

## 2019-11-09 DIAGNOSIS — Z79899 Other long term (current) drug therapy: Principal | ICD-10-CM

## 2019-11-09 DIAGNOSIS — Z944 Liver transplant status: Principal | ICD-10-CM

## 2019-11-09 NOTE — Unmapped (Signed)
Spoke with patient by phone to relay she needs to obtain routine lab draw (last in August) - she verbalized understanding.    She confirms recently securing [employer] coverage and plans to go soon but needs babysitter for her son.  She verbalized understanding.

## 2019-11-10 MED FILL — TACROLIMUS 1 MG CAPSULE, IMMEDIATE-RELEASE: ORAL | 30 days supply | Qty: 120 | Fill #2

## 2019-11-10 MED FILL — TACROLIMUS 1 MG CAPSULE: 30 days supply | Qty: 120 | Fill #2 | Status: AC

## 2019-11-13 LAB — ALBUMIN: Albumin:MCnc:Pt:Ser/Plas:Qn:: 4.8

## 2019-11-13 LAB — COMPREHENSIVE METABOLIC PANEL
A/G RATIO: 1.7 (ref 1.2–2.2)
ALBUMIN: 4.8 g/dL (ref 3.8–4.9)
ALKALINE PHOSPHATASE: 146 IU/L — ABNORMAL HIGH (ref 39–117)
ALT (SGPT): 31 IU/L (ref 0–32)
BLOOD UREA NITROGEN: 24 mg/dL (ref 6–24)
BUN / CREAT RATIO: 26 — ABNORMAL HIGH (ref 9–23)
CALCIUM: 9.9 mg/dL (ref 8.7–10.2)
CO2: 24 mmol/L (ref 20–29)
CREATININE: 0.91 mg/dL (ref 0.57–1.00)
GLOBULIN, TOTAL: 2.8 g/dL (ref 1.5–4.5)
GLUCOSE: 88 mg/dL (ref 65–99)
SODIUM: 140 mmol/L (ref 134–144)
TOTAL PROTEIN: 7.6 g/dL (ref 6.0–8.5)

## 2019-11-13 LAB — MAGNESIUM: Magnesium:MCnc:Pt:Ser/Plas:Qn:: 1.8

## 2019-11-13 LAB — CBC W/ DIFFERENTIAL
BANDED NEUTROPHILS ABSOLUTE COUNT: 0 10*3/uL (ref 0.0–0.1)
BASOPHILS ABSOLUTE COUNT: 0 10*3/uL (ref 0.0–0.2)
BASOPHILS RELATIVE PERCENT: 1 %
EOSINOPHILS ABSOLUTE COUNT: 0.3 10*3/uL (ref 0.0–0.4)
EOSINOPHILS RELATIVE PERCENT: 5 %
HEMATOCRIT: 50.5 % — ABNORMAL HIGH (ref 34.0–46.6)
HEMOGLOBIN: 17 g/dL — ABNORMAL HIGH (ref 11.1–15.9)
IMMATURE GRANULOCYTES: 0 %
LYMPHOCYTES ABSOLUTE COUNT: 1.5 10*3/uL (ref 0.7–3.1)
LYMPHOCYTES RELATIVE PERCENT: 25 %
MEAN CORPUSCULAR HEMOGLOBIN CONC: 33.7 g/dL (ref 31.5–35.7)
MEAN CORPUSCULAR HEMOGLOBIN: 30 pg (ref 26.6–33.0)
MEAN CORPUSCULAR VOLUME: 89 fL (ref 79–97)
MONOCYTES ABSOLUTE COUNT: 0.6 10*3/uL (ref 0.1–0.9)
MONOCYTES RELATIVE PERCENT: 10 %
NEUTROPHILS ABSOLUTE COUNT: 3.6 10*3/uL (ref 1.4–7.0)
NEUTROPHILS RELATIVE PERCENT: 59 %
RED CELL DISTRIBUTION WIDTH: 12.6 % (ref 11.7–15.4)
WHITE BLOOD CELL COUNT: 6.1 10*3/uL (ref 3.4–10.8)

## 2019-11-13 LAB — GAMMA GLUTAMYL TRANSFERASE: Gamma glutamyl transferase:CCnc:Pt:Ser/Plas:Qn:: 83 — ABNORMAL HIGH

## 2019-11-13 LAB — RED BLOOD CELL COUNT: Erythrocytes:NCnc:Pt:Bld:Qn:Automated count: 5.66 — ABNORMAL HIGH

## 2019-11-13 LAB — BILIRUBIN DIRECT: Bilirubin.glucuronidated+Bilirubin.albumin bound:MCnc:Pt:Ser/Plas:Qn:: 0.22

## 2019-11-13 LAB — PHOSPHORUS, SERUM: Phosphate:MCnc:Pt:Ser/Plas:Qn:: 3.5

## 2019-11-14 LAB — TACROLIMUS BLOOD: Tacrolimus:MCnc:Pt:Bld:Qn:LC/MS/MS: 4.2

## 2019-11-16 DIAGNOSIS — Z944 Liver transplant status: Principal | ICD-10-CM

## 2019-11-16 DIAGNOSIS — Z79899 Other long term (current) drug therapy: Principal | ICD-10-CM

## 2019-12-02 NOTE — Unmapped (Signed)
[facility] Refill Coordination Note    Specialty Medication(s) to be Shipped:   Transplant: tacrolimus 1mg     Other medication(s) to be shipped: N/A     Karen Rogers, DOB: December 13, 1965  Phone: [phone] (home)       All above HIPAA information was verified with patient.     Was a Nurse, learning disability used for this call? No    Completed refill call assessment today to schedule patient's medication shipment from the [facility] [phone]).       Specialty medication(s) and dose(s) confirmed: Regimen is correct and unchanged.   Changes to medications: Toriana reports no changes at this time.  Changes to insurance: No  Questions for the pharmacist: No    Confirmed patient received Welcome Packet with first shipment. The patient will receive a drug information handout for each medication shipped and additional FDA Medication Guides as required.       DISEASE/MEDICATION-SPECIFIC INFORMATION        N/A    SPECIALTY MEDICATION ADHERENCE     Medication Adherence    Patient reported X missed doses in the last month: 3-4  Specialty Medication: Tacrolimus 1mg   Patient is on additional specialty medications: No  Adherence tools used: cell phone          Tacrolimus 1 mg: 10 days of medicine on hand     SHIPPING     Shipping address confirmed in Epic.     Delivery Scheduled: Yes, Expected medication delivery date: 12/11/2019.     Medication will be delivered via UPS to the prescription address in Epic WAM.    [name] [facility] Specialty Technician

## 2019-12-07 DIAGNOSIS — Z944 Liver transplant status: Principal | ICD-10-CM

## 2019-12-07 DIAGNOSIS — Z79899 Other long term (current) drug therapy: Principal | ICD-10-CM

## 2019-12-10 MED FILL — TACROLIMUS 1 MG CAPSULE, IMMEDIATE-RELEASE: ORAL | 30 days supply | Qty: 120 | Fill #3

## 2019-12-10 MED FILL — TACROLIMUS 1 MG CAPSULE, IMMEDIATE-RELEASE: 30 days supply | Qty: 120 | Fill #3 | Status: AC

## 2019-12-17 NOTE — Unmapped (Signed)
Patient called to ask about platelet count as she notes PCP was concerned.  Reviewed platelet count remains stable for her baseline.  Advised she should continue aspirin at this time given h/o hepatic artery stenosis.      She reports recent weight loss from 197 to 174 with new diet and was happy about this.      She plans to repeat routine labs again soon.

## 2020-01-04 DIAGNOSIS — Z79899 Other long term (current) drug therapy: Principal | ICD-10-CM

## 2020-01-04 DIAGNOSIS — Z944 Liver transplant status: Principal | ICD-10-CM

## 2020-01-05 DIAGNOSIS — B169 Acute hepatitis B without delta-agent and without hepatic coma: Principal | ICD-10-CM

## 2020-01-05 DIAGNOSIS — Z944 Liver transplant status: Principal | ICD-10-CM

## 2020-01-05 DIAGNOSIS — Z79899 Other long term (current) drug therapy: Principal | ICD-10-CM

## 2020-01-05 NOTE — Unmapped (Signed)
Spoke with patient by phone to discuss annual post transplant visits.  She will complete labs and imaging @ [facility] campus and complete virtual visit with PA Tina Griffiths.  Provided appt dates and time to patient - she verbalized understanding.

## 2020-01-08 NOTE — Unmapped (Signed)
The [facility] has made a third and final attempt to reach this patient to refill the following medication:tacrolimus, entecavir, mycophenolate .      We have left voicemails on the following phone numbers: [phone] .    Dates contacted: 4/26, 4/30, 5/7  Last scheduled delivery: 2/9 for 90ds for entec and mycophen, 4/8 for 30ds for tac- per fill history patient may be out of medications    The patient may be at risk of non-compliance with this medication. The patient should call the [facility] at [phone] (option 4) to refill medication.    Thad Ranger   [facility] Specialty Pharmacist

## 2020-01-21 ENCOUNTER — Encounter: Admit: 2020-01-21 | Discharge: 2020-01-22 | Payer: TRICARE (CHAMPUS)

## 2020-01-21 ENCOUNTER — Ambulatory Visit: Admit: 2020-01-21 | Discharge: 2020-01-22 | Payer: TRICARE (CHAMPUS)

## 2020-01-21 ENCOUNTER — Non-Acute Institutional Stay: Admit: 2020-01-21 | Discharge: 2020-01-22 | Payer: TRICARE (CHAMPUS)

## 2020-01-21 LAB — CBC W/ AUTO DIFF
BASOPHILS ABSOLUTE COUNT: 0 10*9/L (ref 0.0–0.1)
BASOPHILS RELATIVE PERCENT: 0.6 %
EOSINOPHILS ABSOLUTE COUNT: 0.3 10*9/L (ref 0.0–0.7)
EOSINOPHILS RELATIVE PERCENT: 5.6 %
HEMATOCRIT: 46.3 % — ABNORMAL HIGH (ref 35.0–44.0)
HEMOGLOBIN: 15.3 g/dL (ref 12.0–15.5)
LYMPHOCYTES ABSOLUTE COUNT: 1.3 10*9/L (ref 0.7–4.0)
LYMPHOCYTES RELATIVE PERCENT: 23.3 %
MEAN CORPUSCULAR HEMOGLOBIN CONC: 33.2 g/dL (ref 30.0–36.0)
MEAN CORPUSCULAR VOLUME: 89.1 fL (ref 82.0–98.0)
MEAN PLATELET VOLUME: 10.8 fL — ABNORMAL HIGH (ref 7.0–10.0)
MONOCYTES ABSOLUTE COUNT: 0.6 10*9/L (ref 0.1–1.0)
MONOCYTES RELATIVE PERCENT: 10.1 %
NEUTROPHILS ABSOLUTE COUNT: 3.3 10*9/L (ref 1.7–7.7)
NEUTROPHILS RELATIVE PERCENT: 60.4 %
NUCLEATED RED BLOOD CELLS: 0 /100{WBCs} (ref <=4)
PLATELET COUNT: 127 10*9/L — ABNORMAL LOW (ref 150–450)
RED BLOOD CELL COUNT: 5.19 10*12/L — ABNORMAL HIGH (ref 3.90–5.03)
RED CELL DISTRIBUTION WIDTH: 13.8 % (ref 12.0–15.0)

## 2020-01-21 LAB — COMPREHENSIVE METABOLIC PANEL
ALKALINE PHOSPHATASE: 125 U/L — ABNORMAL HIGH (ref 46–116)
ALT (SGPT): 18 U/L (ref 10–49)
ANION GAP: 6 mmol/L (ref 3–11)
AST (SGOT): 20 U/L (ref <34)
BILIRUBIN TOTAL: 0.6 mg/dL (ref 0.3–1.2)
BLOOD UREA NITROGEN: 21 mg/dL (ref 9–23)
BUN / CREAT RATIO: 27
CALCIUM: 9.6 mg/dL (ref 8.7–10.4)
CHLORIDE: 106 mmol/L (ref 98–107)
CO2: 29.3 mmol/L (ref 20.0–31.0)
CREATININE: 0.79 mg/dL (ref 0.60–1.10)
EGFR CKD-EPI AA FEMALE: >90 mL/min/{1.73_m2}
EGFR CKD-EPI NON-AA FEMALE: 85 mL/min/{1.73_m2}
GLUCOSE RANDOM: 94 mg/dL (ref 70–179)
PROTEIN TOTAL: 7.2 g/dL (ref 5.7–8.2)
SODIUM: 141 mmol/L (ref 135–145)

## 2020-01-21 LAB — MAGNESIUM: Magnesium:MCnc:Pt:Ser/Plas:Qn:: 1.6

## 2020-01-21 LAB — ANION GAP: Anion gap 3:SCnc:Pt:Ser/Plas:Qn:: 6

## 2020-01-21 LAB — PHOSPHORUS: Phosphate:MCnc:Pt:Ser/Plas:Qn:: 3.8

## 2020-01-21 LAB — BILIRUBIN DIRECT: Bilirubin.glucuronidated+Bilirubin.albumin bound:MCnc:Pt:Ser/Plas:Qn:: 0.3

## 2020-01-21 LAB — GAMMA GLUTAMYL TRANSFERASE: Gamma glutamyl transferase:CCnc:Pt:Ser/Plas:Qn:: 55 — ABNORMAL HIGH

## 2020-01-21 LAB — MONOCYTES RELATIVE PERCENT: Monocytes/100 leukocytes:NFr:Pt:Bld:Qn:Automated count: 10.1

## 2020-01-21 NOTE — Unmapped (Signed)
Patient's annual imaging including: chest xray and liver ultrasound, reviewed by Georgina Quint, PA via inbasket with no recommendation for follow-up other than routine annual imaging per protocol.

## 2020-01-22 LAB — TACROLIMUS BLOOD: Lab: 3.9

## 2020-01-22 LAB — HEPATITIS B SURFACE ANTIGEN: Hepatitis B virus surface Ag:PrThr:Pt:Ser:Ord:: NONREACTIVE

## 2020-01-25 LAB — HBV DNA QUANT (MAYO): Hepatitis B virus DNA:ACnc:Pt:Ser/Plas:Qn:Probe.amp.tar: NOT DETECTED

## 2020-01-25 NOTE — Unmapped (Addendum)
01/25/20 - Call placed to patient in preparation for annual post liver transplant virtual visit with Tina Griffiths, PA on 5/25.  Unable to reach patient by phone.    01/26/20 - Spoke with patient by phone - reviewed and updated medication list in Epic.  Patient denies any new diagnoses or hospitalizations since last visit.  She denies N/V/D/F/ alcohol, tobacco or drug use.  She is seen locally for primary care by PA Tobie Lords, fills meds via [facility] and obtains local labs @ [facility] (reinforced these should be collected monthly).    She has lost ~20lbs working with local primary care although some weight gain related to thyroid function (recent medication adjustments by PCP) and was happy about this.    Continues to work part time.  Her son Jeannett Senior (~20) with special needs lives with her.    Encouraged establishment with a dermatologist for skin surveillance.  Uses sunscreen and limits sun exposure.    Last colonoscopy in 2017 and due for repeat in 2017.      Discussed COVID vaccine at length with patient in previous call - she declines obtaining it at that time despite encouragement and discussion or rationale.

## 2020-01-26 ENCOUNTER — Encounter
Admit: 2020-01-26 | Discharge: 2020-01-27 | Payer: TRICARE (CHAMPUS) | Attending: Gastroenterology | Primary: Gastroenterology

## 2020-01-26 DIAGNOSIS — Z944 Liver transplant status: Principal | ICD-10-CM

## 2020-01-26 MED ORDER — MYCOPHENOLATE SODIUM 180 MG TABLET,DELAYED RELEASE
ORAL_TABLET | Freq: Two times a day (BID) | ORAL | 3 refills | 90 days | Status: CP
Start: 2020-01-26 — End: ?

## 2020-01-26 NOTE — Unmapped (Signed)
Per Tina Griffiths, PA given lft stability patient can reduce myfortic from 540.mg BID to 360.mg BID - patient committed to getting routine monthly labs given history of rejection.  Per Lorin Picket can assess further reduction in the future if labs remain stable with routine lab draws.

## 2020-01-26 NOTE — Unmapped (Signed)
[facility], [facility], [place]  [address] [place]  [place], [place]  [address]  Ph: [phone]  Fax: [phone]    01/26/2020    Patient Care Team:  Tedd Sias, PA as PCP - General  Anice Paganini, MSW (Inactive) as Case Manager/Social Worker (Transplant)  Moises Blood, RN as Transplant Coordinator (Transplant)  [facility] as Consulting Physician (Gynecology)  Christiane Lexine Baton, FNP as Referring Physician (Gastroenterology)  Mack Hook, MD as Consulting Physician (Transplant Hepatology)    RE: Braleigh Massoud; DOB: 1966/08/05      Reason for visit: Status post OLT    HPI: Ms. Pistole is a pleasant 54 year old Caucasian female who is now almost 6 years status post transplant for alpha-1 antitrypsin deficiency induced cirrhosis.   Today's visit is being done via video.  She was last seen on 01/27/2019.   Her post transplant course has been complicated by a biliary anastomotic stricture.  Her last ERCP was on 07/04/2016 with stent being removed.  She also had rejection by biopsy on 04/14/2015 (RAI 6/9), repeat biopsy on 05/24/2015 was negative for rejection.  In regards of her immunosuppression she is currently on mycophenolate 540 mg twice daily and Prograf 2 mg bid.  She is also on entecavir for a total hep B core antibody positive donor.  Tac level remains at 4???6.  She also has history of hepatic artery thrombosis is on aspirin 325 mg daily.  Her last colonoscopy was in 2017.        Today she overall feels well denies any fever, chills, headache, jaundice, chest pain, upper lower GI bleed, melena confusion.        PMH:  Patient Active Problem List   Diagnosis   ??? Alpha-1-antitrypsin deficiency (CMS-HCC)   ??? Asthma   ??? GERD (gastroesophageal reflux disease)   ??? Hypothyroid   ??? Liver transplant 01/30/2014     Past Medical History:   Diagnosis Date   ??? Alpha-1-antitrypsin deficiency (CMS-HCC)    ??? Asthma     better since moving to [place]   ??? Cirrhosis (CMS-HCC) 01/2012 -varices, +ascites, +encephalopathy   ??? History of transfusion    ??? Hypothyroidism    ??? Liver transplant 01/30/2014 02/03/2014   ??? Osteoporosis    ??? Reflux        PSH:  Past Surgical History:   Procedure Laterality Date   ??? CHG SONO GUIDE NEEDLE BIOPSY N/A 05/24/2015    Procedure: ULTRASONIC GUIDANCE NEEDLE BX-RAD S & I;  Surgeon: Joaquin Music, MD;  [facility]: [facility];  Service: Gastroenterology   ??? ELBOW FRACTURE SURGERY Right    ??? LIVER TRANSPLANTATION     ??? ORIF HIP FRACTURE Left 2004    hardware in place   ??? PR COLONOSCOPY FLX DX W/COLLJ SPEC WHEN PFRMD N/A 05/08/2016    Procedure: COLONOSCOPY, FLEXIBLE, PROXIMAL TO SPLENIC FLEXURE; DIAGNOSTIC, W/WO COLLECTION SPECIMEN BY BRUSH OR WASH;  Surgeon: Joaquin Music, MD;  Location: [facility];  Service: Gastroenterology   ??? PR ERCP BALLOON DILATE BILIARY/PANC DUCT/AMPULLA EA N/A 05/31/2015    Procedure: ERCP;WITH TRANS-ENDOSCOPIC BALLOON DILATION OF BILIARY/PANCREATIC DUCT(S) OR OF AMPULLA, INCLUDING SPHINCTERECTOMY, WHEN PERFOREMD,EACH DUCT (65784);  Surgeon: Malcolm Metro, MD;  Location: GI PROCEDURES [facility];  Service: Gastroenterology   ??? PR ERCP BILIARY/PANC DUCT STENT EXCHANGE W/DIL&WIRE N/A 05/04/2014    Procedure: ENDOSCOPIC RETROGRADE CHOLANGIOPANCREATOGRAPHY (ERCP); WITH REMOVAL AND EXCHANGE OF STENT(S), BILIARY OR PANCREATIC DUCT;  Surgeon: Malcolm Metro, MD;  Location: [facility] PROC[facility]URES [facility];  Service: Gastroenterology   ??? PR ERCP BILIARY/PANC DUCT STENT EXCHANGE W/DIL&WIRE N/A 06/08/2014    Procedure: ENDOSCOPIC RETROGRADE CHOLANGIOPANCREATOGRAPHY (ERCP); WITH REMOVAL AND EXCHANGE OF STENT(S), BILIARY OR PANCREATIC DUCT;  Surgeon: Malcolm Metro, MD;  Location: [facility] PROCEDURES [facility];  Service: Gastroenterology   ??? PR ERCP BILIARY/PANC DUCT STENT EXCHANGE W/DIL&WIRE N/A 09/10/2014    Procedure: ENDOSCOPIC RETROGRADE CHOLANGIOPANCREATOGRAPHY (ERCP); WITH REMOVAL AND EXCHANGE OF STENT(S), BILIARY OR PANCREATIC DUCT;  Surgeon: Malcolm Metro, MD;  Location: [facility]URES [facility];  Service: Gastroenterology   ??? PR ERCP BILIARY/PANC DUCT STENT EXCHANGE W/DIL&WIRE N/A 09/30/2015    Procedure: ENDOSCOPIC RETROGRADE CHOLANGIOPANCREATOGRAPHY (ERCP); WITH REMOVAL AND EXCHANGE OF STENT(S), BILIARY OR PANCREATIC DUCT;  Surgeon: Chriss Driver, MD;  Location: [facility] PROC[facility]URES [facility];  Service: Gastroenterology   ??? PR ERCP REMOVE FOREIGN BODY/STENT BILIARY/PANC DUCT N/A 02/15/2015    Procedure: ENDOSCOPIC RETROGRADE CHOLANGIOPANCREATOGRAPHY (ERCP); W/ REMOVAL OF FOREIGN BODY/STENT FROM BILIARY/PANCREATIC DUCT(S);  Surgeon: Malcolm Metro, MD;  Location: GI PROCEDURES [facility];  Service: Gastroenterology   ??? PR ERCP STENT PLACEMENT BILIARY/PANCREATIC DUCT N/A 02/23/2014    Procedure: ENDOSCOPIC RETROGRADE CHOLANGIOPANCREATOGRAPHY (ERCP); WITH PLACEMENT OF ENDOSCOPIC STENT INTO BILIARY OR PANCREATIC DUCT;  Surgeon: Malcolm Metro, MD;  Location: GI PROCEDURES [facility];  Service: Gastroenterology   ??? PR ERCP STENT PLACEMENT BILIARY/PANCREATIC DUCT N/A 05/31/2015    Procedure: ENDOSCOPIC RETROGRADE CHOLANGIOPANCREATOGRAPHY (ERCP); WITH PLACEMENT OF ENDOSCOPIC STENT INTO BILIARY OR PANCREATIC DUCT;  Surgeon: Malcolm Metro, MD;  Location: GI PROC[facility]URES [facility];  Service: Gastroenterology   ??? PR ERCP,INSERT STENT,BILIARY/PANC N/A 09/27/2014    Procedure: ERCP; W/ENDO RETRO INSRT TUBE/STENT-BILE DUCT;  Surgeon: Mayford Knife, MD;  Location: MAIN OR [facility];  Service: Gastroenterology   ??? PR ERCP,W/REMOVAL STONE,BIL/PANCR DUCTS N/A 02/23/2014    Procedure: ERCP; W/ENDOSCOPIC RETROGRADE REMOVAL OF CALCULUS/CALCULI FROM BILIARY &/OR PANCREATIC DUCTS;  Surgeon: Malcolm Metro, MD;  Location: [facility];  Service: Gastroenterology   ??? PR ERCP,W/REMOVAL STONE,BIL/PANCR DUCTS N/A 02/15/2015    Procedure: ERCP; W/ENDOSCOPIC RETROGRADE REMOVAL OF CALCULUS/CALCULI FROM BILIARY &/OR PANCREATIC DUCTS;  Surgeon: Malcolm Metro, MD;  Location: [facility]URES [facility];  Service: Gastroenterology   ??? PR ERCP,W/REMOVAL STONE,BIL/PANCR DUCTS N/A 05/31/2015    Procedure: ERCP; W/ENDOSCOPIC RETROGRADE REMOVAL OF CALCULUS/CALCULI FROM BILIARY &/OR PANCREATIC DUCTS;  Surgeon: Malcolm Metro, MD;  Location: GI PROC[facility]URES [facility];  Service: Gastroenterology   ??? PR ERCP,W/REMOVAL STONE,BIL/PANCR DUCTS N/A 09/30/2015    Procedure: ERCP; W/ENDOSCOPIC RETROGRADE REMOVAL OF CALCULUS/CALCULI FROM BILIARY &/OR PANCREATIC DUCTS;  Surgeon: Chriss Driver, MD;  Location: GI PROCEDURES [facility];  Service: Gastroenterology   ??? PR ERCP,W/REMOVAL STONE,BIL/PANCR DUCTS N/A 07/04/2016    Procedure: ERCP; W/ENDOSCOPIC RETROGRADE REMOVAL OF CALCULUS/CALCULI FROM BILIARY &/OR PANCREATIC DUCTS;  Surgeon: Chriss Driver, MD;  Location: [facility] PROC[facility]URES [facility];  Service: Gastroenterology   ??? PR NEEDLE BIOPSY LIVER N/A 04/14/2015    Procedure: BX LIVER NEEDLE; [id number];  Surgeon: Janyth Pupa, MD;  Location: [facility]URES [facility];  Service: Gastroenterology   ??? PR NEEDLE BIOPSY LIVER N/A 05/24/2015    Procedure: BX LIVER NEEDLE; EAVWUJ;  Surgeon: Joaquin Music, MD;  Location: [facility];  Service: Gastroenterology   ??? PR PERQ DRAINAGE PLEURA INSERT CATH W/IMAGING Right 01/22/2014    Procedure: PLEURAL DRAINAGE, PERC, W INSERTION OF INDWELLING CATHETER; W IMAGING GUIDANCE;  Surgeon:  Mathis Bud, MD;  Location: BRONCH PROCEDURE LAB [facility];  Service: Pulmonary   ??? PR TRANSPLANT LIVER,ALLOTRANSPLANT Midline 01/30/2014    Procedure: LIVER ALLOTRANSPLANTATION; ORTHOTOPIC, PARTIAL OR WHOLE, FROM CADAVER OR LIVING DONOR, ANY AGE;  Surgeon: Vivi Barrack, MD;  Location: MAIN OR [facility];  Service: Transplant       MEDICATIONS:  Current Outpatient Medications   Medication Sig Dispense Refill   ??? acetaminophen (TYLENOL) 325 MG tablet Take 2 tablets (650 mg total) by mouth every eight (8) hours as needed for pain. 30 tablet 0   ??? aspirin 325 MG tablet Take 325 mg by mouth daily.     ??? cholecalciferol, vitamin D3, 2,000 unit cap Take 1 capsule (2,000 Units total) by mouth daily. 90 capsule 3   ??? entecavir (BARACLUDE) 0.5 MG tablet Take 1 tablet (0.5 mg total) by mouth daily. 90 each 3   ??? fluticasone propion-salmeterol (ADVAIR DISKUS) 100-50 mcg/dose diskus Inhale. Only takes in the winter     ??? hydrOXYzine (ATARAX) 25 MG tablet Take 25 mg by mouth daily.     ??? levalbuterol (XOPENEX HFA) 45 mcg/actuation inhaler Inhale 2 puffs. As needed     ??? levothyroxine (SYNTHROID, LEVOTHROID) 125 MCG tablet Take 125 mcg by mouth daily at 0600.     ??? mycophenolate (MYFORTIC) 180 MG EC tablet Take 3 tablets (540 mg total) by mouth Two (2) times a day. 540 tablet 3   ??? OMEGA-3/DHA/EPA/FISH OIL (FISH OIL-OMEGA-3 FATTY ACIDS) 300-1,000 mg capsule Take 1,200 mg by mouth daily.     ??? omeprazole (PRILOSEC) 20 MG capsule Take 20 mg by mouth daily. 4/10: pt takes omep 20 every other day     ??? phentermine 15 MG capsule Take 15 mg by mouth every morning.     ??? tacrolimus (PROGRAF) 1 MG capsule Take 2 capsules (2 mg total) by mouth two (2) times a day. 360 capsule 3     No current facility-administered medications for this visit.       ALLERGIES:  Fosamax [alendronate], Other, and Furosemide        SH: Quit tobacco in 2013.  No alcohol use      RoS: Negative on 10 systems review other than what is mentioned above.      LABS:  Lab Results   Component Value Date    WBC 5.5 01/21/2020    HGB 15.3 01/21/2020    HCT 46.3 (H) 01/21/2020    PLT 127 (L) 01/21/2020       Lab Results   Component Value Date    NA 141 01/21/2020    K 3.7 01/21/2020    CL 106 01/21/2020    CO2 29.3 01/21/2020    BUN 21 01/21/2020    CREATININE 0.79 01/21/2020    GLU 94 01/21/2020    CALCIUM 9.6 01/21/2020    MG 1.6 01/21/2020    PHOS 3.8 01/21/2020       Lab Results   Component Value Date    BILITOT 0.6 01/21/2020    BILIDIR 0.30 01/21/2020    PROT 7.2 01/21/2020    ALBUMIN 4.0 01/21/2020    ALT 18 01/21/2020    AST 20 01/21/2020    ALKPHOS 125 (H) 01/21/2020    GGT 55 (H) 01/21/2020       Lab Results   Component Value Date    PT 10.4 05/23/2015    INR CANCELED 05/23/2015    INR 1.00 05/23/2015    INR 1.0 05/23/2015  APTT 34.6 10/29/2014         ASSESSMENT:  Ms. Karen Rogers is a pleasant 54 year old Caucasian female who is now almost 6 years status post transplant for alpha-1 antitrypsin deficiency induced cirrhosis.   Today's visit is being done via video.   She was last seen hepatology clinic on 01/27/2019.   Her post transplant course has been complicated by a biliary anastomotic stricture.  Her last ERCP was on 07/04/2016 with stent being removed.  She also had rejection by biopsy on 04/14/2015 (RAI 6/9), repeat biopsy on 05/24/2015 was negative for rejection.  In regards of her immunosuppression she is currently on mycophenolate 540 mg twice daily and Prograf 2 mg bid.  She is also on entecavir for a total hep B core antibody positive donor.  Tac level remains at 4???6.  She also has history of hepatic artery thrombosis is on aspirin 325 mg daily.  Her last colonoscopy was in 2017.          PLAN:  1. I have discussed her care with Dr. Pervis Hocking  2.  Check laboratory studies monthly.    3.  Decrease Myfortic to 360 mg twice daily.  Continue tacrolimus 2 mg twice daily.  4.  Recommend annual skin surveillance with dermatology.  Patient will arrange locally.  5.  Plan to see Ms. Mattes back to liver clinic in 1 year.      I spent 17 minutes on the real-time audio and video visit with the patient on the date of service. I spent an additional 7 minutes on pre- and post-visit activities on the date of service.     The patient was not located and I was not located within 250 yards of a hospital based location during the real-time audio and video visit. The patient was physically located in [place] or a state in which I am permitted to provide care. The patient and/or parent/guardian understood that s/he may incur co-pays and cost sharing, and agreed to the telemedicine visit. The visit was reasonable and appropriate under the circumstances given the patient's presentation at the time.    The patient and/or parent/guardian has been advised of the potential risks and limitations of this mode of treatment (including, but not limited to, the absence of in-person examination) and has agreed to be treated using telemedicine. The patient's/patient's family's questions regarding telemedicine have been answered.    If the visit was completed in an ambulatory setting, the patient and/or parent/guardian has also been advised to contact their provider???s office for worsening conditions, and seek emergency medical treatment and/or call 911 if the patient deems either necessary.        Tina Griffiths, PA-C  [facility]  [address]  [place], [place]  [address]  Ph: [phone]  Fax: [phone]

## 2020-01-26 NOTE — Unmapped (Signed)
Change in Mycophenolate dosage decrease. Co-pay $39 for 90 day.

## 2020-02-01 DIAGNOSIS — Z79899 Other long term (current) drug therapy: Principal | ICD-10-CM

## 2020-02-01 DIAGNOSIS — Z944 Liver transplant status: Principal | ICD-10-CM

## 2020-02-29 DIAGNOSIS — Z944 Liver transplant status: Principal | ICD-10-CM

## 2020-02-29 DIAGNOSIS — Z79899 Other long term (current) drug therapy: Principal | ICD-10-CM

## 2020-03-28 DIAGNOSIS — Z79899 Other long term (current) drug therapy: Principal | ICD-10-CM

## 2020-03-28 DIAGNOSIS — Z944 Liver transplant status: Principal | ICD-10-CM

## 2020-04-06 NOTE — Unmapped (Signed)
8/3: patient has counted her medication supply. She is only short on entecavir so we will fill it for her (see note below). However for tac, mycophen, and entecavir going [place]. [place] an account with them this week and will let her coordinator know next Monday where to send new rxs for future fills. I will message clinic as well.    [facility] Clinical Assessment & Refill Coordination Note    Karen Rogers, DOB: 01-13-1966  Phone: [phone] (home)     All above HIPAA information was verified with patient.     Was a Nurse, learning disability used for this call? No    Specialty Medication(s):   Transplant:  mycophenolic acid 180mg , tacrolimus 1mg  and entecavir 0.5mg      Current Outpatient Medications   Medication Sig Dispense Refill   ??? acetaminophen (TYLENOL) 325 MG tablet Take 2 tablets (650 mg total) by mouth every eight (8) hours as needed for pain. 30 tablet 0   ??? aspirin 325 MG tablet Take 325 mg by mouth daily.     ??? cholecalciferol, vitamin D3, 2,000 unit cap Take 1 capsule (2,000 Units total) by mouth daily. 90 capsule 3   ??? entecavir (BARACLUDE) 0.5 MG tablet Take 1 tablet (0.5 mg total) by mouth daily. 90 each 3   ??? fluticasone propion-salmeterol (ADVAIR DISKUS) 100-50 mcg/dose diskus Inhale. Only takes in the winter     ??? hydrOXYzine (ATARAX) 25 MG tablet Take 25 mg by mouth daily.     ??? levalbuterol (XOPENEX HFA) 45 mcg/actuation inhaler Inhale 2 puffs. As needed     ??? levothyroxine (SYNTHROID, LEVOTHROID) 125 MCG tablet Take 125 mcg by mouth daily at 0600.     ??? mycophenolate (MYFORTIC) 180 MG EC tablet Take 2 tablets (360 mg total) by mouth Two (2) times a day. 360 tablet 3   ??? OMEGA-3/DHA/EPA/FISH OIL (FISH OIL-OMEGA-3 FATTY ACIDS) 300-1,000 mg capsule Take 1,200 mg by mouth daily.     ??? omeprazole (PRILOSEC) 20 MG capsule Take 20 mg by mouth daily. 4/10: pt takes omep 20 every other day     ??? phentermine 15 MG capsule Take 15 mg by mouth every morning.     ??? tacrolimus (PROGRAF) 1 MG capsule Take 2 capsules (2 mg total) by mouth two (2) times a day. 360 capsule 3     No current facility-administered medications for this visit.        Changes to medications: Karen Rogers reports no changes at this time.    Allergies   Allergen Reactions   ??? Fosamax [Alendronate]      Itchy throat and rash   ??? Other      Wasp   sting   ??? Furosemide      Other reaction(s): Other (See Comments)  Abdominal pain, fever.  Trouble breathing, severe abdominal pain after 1 week       Changes to allergies: No    SPECIALTY MEDICATION ADHERENCE     entecavir 0.5mg   : 3 days of medicine on hand   Tacrolimus 1mg   : 25 days of medicine on hand   Mycophenolate 180mg   : 25 days of medicine on hand       Medication Adherence    Patient reported X missed doses in the last month: 0  Specialty Medication: tacrolimus 1mg   Patient is on additional specialty medications: Yes  Additional Specialty Medications: entecavir 0.5mg   Patient Reported  Additional Medication X Missed Doses in the Last Month: 0  Patient is on more than two specialty medications: Yes  Specialty Medication: mycophenolate 180mg   Patient Reported Additional Medication X Missed Doses in the Last Month: 0  Adherence tools used: cell phone          Specialty medication(s) dose(s) confirmed: Regimen is correct and unchanged.     Are there any concerns with adherence? No    Adherence counseling provided? Not needed    CLINICAL MANAGEMENT AND INTERVENTION      Clinical Benefit Assessment:    Do you feel the medicine is effective or helping your condition? Yes    Clinical Benefit counseling provided? Not needed    Adverse Effects Assessment:    Are you experiencing any side effects? No    Are you experiencing difficulty administering your medicine? No    Quality of Life Assessment:    How many days over the past month did your transplant  keep you from your normal activities? For example, brushing your teeth or getting up in the morning. 0    Have you discussed this with your provider? Not needed    Therapy Appropriateness:    Is therapy appropriate? Yes, therapy is appropriate and should be continued    DISEASE/MEDICATION-SPECIFIC INFORMATION      N/A    PATIENT SPECIFIC NEEDS     - Does the patient have any physical, cognitive, or cultural barriers? No    - Is the patient high risk? Yes, patient is taking a REMS drug. Medication is dispensed in compliance with REMS program    - Does the patient require a Care Management Plan? No     - Does the patient require physician intervention or other additional services (i.e. nutrition, smoking cessation, social work)? No      SHIPPING     Specialty Medication(s) to be Shipped:   Transplant: entecavir 0.5mg     Other medication(s) to be shipped: No additional medications requested for fill at this time     Changes to insurance: No    Delivery Scheduled: Yes, Expected medication delivery date: 04/08/2020.     Medication will be delivered via UPS to the confirmed prescription address in [facility].    The patient will receive a drug information handout for each medication shipped and additional FDA Medication Guides as required.  Verified that patient has previously received a Conservation officer, historic buildings.    All of the patient's questions and concerns have been addressed.    Thad Ranger   [facility] Specialty Pharmacist

## 2020-04-06 NOTE — Unmapped (Signed)
This patient has been disenrolled from the [facility] specialty pharmacy services due to a pharmacy change. [place] is [place] account with them this week and will let coordinator know next Mon where to send all new rxs. i will notify clinic as well.    Thad Ranger  [facility] Specialty Pharmacist

## 2020-04-06 NOTE — Unmapped (Signed)
8/3: reonboarding mycophenolate, tacrolimus,  and entecavir today per protocol - patient has not filled at [facility] in 5-7 months depending on medication Massie Maroon    [facility]   Patient Onboarding/Medication Counseling    Karen Rogers is a 54 y.o. female with liver transplant who I am counseling today on continuation of therapy.  I am speaking to the patient.    Was a Nurse, learning disability used for this call? No    Verified patient's date of birth / HIPAA.    Specialty medication(s) to be sent: na      Non-specialty medications/supplies to be sent: na      Medications not needed at this time: na     The patient declined counseling on medication administration, missed dose instructions, goals of therapy, side effects and monitoring parameters, warnings and precautions, drug/food interactions and storage, handling precautions, and disposal because they have taken the medication previously. The information in the declined sections below are for informational purposes only and was not discussed with patient.       Myfortic (mycophenolic acid)    Medication & Administration     Dosage:   ??? Take 2 tablets (360mg  total) by mouth twice daily    Administration:   ??? Take with or without food, although taking with food helps minimize GI side effects.  ??? Swallow the pills whole, do not chew or crush    Adherence/Missed dose instructions:  ??? Take a missed dose as soon as you think about it.  ??? If it is less than 2 hours until your next dose, skip the missed dose and go back to your normal time.  ??? Do not take 2 doses at the same time or extra doses.    Goals of Therapy     ??? To prevent organ rejection    Side Effects & Monitoring Parameters     ??? Common side effects  ??? Back or joint pain  ??? Constipation  ??? Headache/dizziness  ??? Not hungry  ??? Stomach pain, diarrhea, constipation, gas, upset stomach, vomiting, nausea  ??? Feeling tired or weak  ??? Shakiness  ??? Trouble sleeping  ??? Increased risk of infection    ??? The following side effects should be reported to the provider:  ??? Allergic reaction  ??? High blood sugar (confusion, feeling sleepy, more thirst, more hungry, passing urine more often, flushing, fast breathing, or breath that smells like fruit)  ??? Electrolyte issues (mood changes, confusion, muscle pain or weakness, a heartbeat that does not feel normal, seizures, not hungry, or very bad upset stomach or throwing up)  ??? High or low blood pressure (bad headache or dizziness, passing out, or change in eyesight)  ??? Kidney issues (unable to pass urine, change in how much urine is passed, blood in the urine, or a big weight gain)  ??? Skin (oozing, heat, swelling, redness, or pain), UTI and other infections   ??? Chest pain or pressure  ??? Abnormal heartbeat  ??? Unexplained bleeding or bruising  ??? Abnormal burning, numbness, or tingling  ??? Muscle cramps,  ??? Yellowing of skin or eyes    ??? Monitoring parameters  ??? Pregnancy test initially prior to treatment and 8-10 days later then as needed)  ??? CBC weekly for first month then twice monthly for next 2 months, then monthly)  ??? Monitor Renal and liver functions  ??? Signs of organ rejection    Contraindications, Warnings, & Precautions     ??? *This is a  REMS drug and an FDA-approved patient medication guide will be printed with each dispensation  ??? Black Box Warning: Infections   ??? Black Box Warning: Lymphoproliferative disorders - risk of development of lymphoma and skin malignancy is increased  ??? Black Box Warning: Use during pregnancy is associated with increased risks of first trimester pregnancy loss and congenital malformations.   ??? Black Box Warning: Females of reproductive potential should use contraception during treatment and for 6 weeks after therapy is discontinued  ??? CNS depression  ??? New or reactivated viral infections  ??? Neutropenia  ??? Female patients and/or their female partners should use effective contraception during treatment of the female patient and for at least 3 months after last dose.  ??? Breastfeeding is not recommended during therapy and for 6 weeks after last dose    Drug/Food Interactions     ??? Medication list reviewed in Epic. The patient was instructed to inform the care team before taking any new medications or supplements. no interactions noted that clinic is not already monitoring.   ??? Do not take Echinacea while on this medication  ??? Check with your doctor before getting any vaccinations (live or inactivated)    Storage, Handling Precautions, & Disposal     ??? Store at room temperature  ??? Keep away from children and pets  ??? This drug is considered hazardous and should be handled as little as possible.  Wash hands before and after touching pills. If someone else helps with medication administration, they should wear gloves.      The patient declined counseling on medication administration, missed dose instructions, goals of therapy, side effects and monitoring parameters, warnings and precautions, drug/food interactions and storage, handling precautions, and disposal because they have taken the medication previously. The information in the declined sections below are for informational purposes only and was not discussed with patient.     Prograf (tacrolimus)    Medication & Administration     Dosage: take 2 capsules (2mg  total) by mouth twice daily     Administration:   ??? May take with or without food  ??? Take 12 hours apart    Adherence/Missed dose instructions:  ??? Take a missed dose as soon as you think about it.  ??? If it is close to the time for your next dose, skip the missed dose and go back to your normal time.  ??? Do not take 2 doses at the same time or extra doses.    Goals of Therapy     ??? To prevent organ rejection    Side Effects & Monitoring Parameters     ??? Common side effects  ??? Dizziness  ??? Fatigue  ??? Headache  ??? Stuffy nose or sore throat  ??? Nausea, vomiting, stomach pain, diarrhea, constipation  ??? Heartburn  ??? Back or joint pain  ??? Increased risk of infection    ??? The following side effects should be reported to the provider:  ??? Allergic reaction  ??? Kidney issues (change in quantity or urine passed, blood in urine, or weight gain)  ??? High blood pressure (dizziness, change in eyesight, headache)  ??? Electrolyte issues (change in mood, confusion, muscle pain, or weakness)  ??? Abnormal breathing  ??? Shakiness  ??? Unexplained bleeding or bruising (gums bleeding, blood in urine, nosebleeds, any abnormal bleeding)  ??? Signs of infection  ??? Skin changes (sores, paleness, new or changed bumps or moles)    ??? Monitoring Parameters  ??? Renal function  ???  Liver function  ??? Glucose levels  ??? Blood pressure  ??? Tacrolimus trough levels  ??? Cardiac monitoring (for QT prolongation)      Contraindications, Warnings, & Precautions     ??? Black Box Warning: Infections - immunosuppressant agents increase the risk of infection that may lead to hospitalization or death  ??? Black Box Warning: Malignancy - immunosuppressant agents may be associated with the development of malignancies that may lead to hospitalization or death  ??? Limit or avoid sun and ultraviolet light exposure, use appropriate sun protection  ??? Myocardial hypertrophy -avoid use in patients with congenital long QT syndrome  ??? Diabetes mellitus - the risk for new-onset diabetes and insulin-dependent post-transplant diabetes mellitus is increased with tacrolimus use after transplantation  ??? GI perforation  ??? Hyperkalemia  ??? Hypertension  ??? Nephrotoxicity  ??? Neurotoxicity  ??? This is a narrow therapeutic index drug. Do not switch manufacturers without first talking to the provider.    Drug/Food Interactions     ??? Medication list reviewed in Epic. The patient was instructed to inform the care team before taking any new medications or supplements. no interactions noted that clinic is not already monitoring.   ??? Avoid alcohol  ??? Avoid grapefruit or grapefruit juice  ??? Avoid live vaccines    Storage, Handling Precautions, & Disposal     ??? Store at room temperature  ??? Keep away from children and pets  The patient declined counseling on medication administration, missed dose instructions, goals of therapy, side effects and monitoring parameters, warnings and precautions, drug/food interactions and storage, handling precautions, and disposal because they have taken the medication previously. The information in the declined sections below are for informational purposes only and was not discussed with patient.     Baraclude (entecavir)    Medication & Administration     Dosage: take 1 tablet (0.5mg  total) by mouth once daily    Administration: Take at least 2 hours after a meal and 2 hours before the next meal.     Adherence/Missed dose instruction: If it is close to your next dose then wait until the next dose and resume your normal dosing schedule. Do not take more than your normal dose to make up for a missed dose.       Goals of Therapy   ??? control of disease/limiting progression of disease       Side Effects & Monitoring Parameters   ??? BBW: Severe acute exacerbations of hepatitis B have been reported upon discontinuation of anti-hepatitis B therapy, including entecavir. Advise patient against sudden discontinuation of drug due to potential exacerbation of hepatitis B  ??? Nausea  ??? Dizziness  ??? Headache  ??? Fatigue    Instructed patient to inform healthcare provider if experiencing any of the following  ??? Symptoms of lactic acidosis (nausea, vomiting, abdominal pain, tachypnea) or hepatotoxicity, including hepatomegaly and steatosis.  ??? Headache  ??? Fatigue    Warnings & Precautions   ??? Advise patient to practice safe sex. Drug does not prevent disease transmission.    Drug/Food Interactions   ??? Medication list reviewed in Epic. The patient was instructed to inform the care team before taking any new medications or supplements. no interactions noted that clinic iis not already monitroing.     Storage, Handling Precautions, & Disposal   ??? This medicine should be stored at room temperature. Keep out of reach of others including children and pets. Keep stored in originally provided packaging until the medication is  ready to be taken. Do not throw away or flush unused medication down the toilet or sink.       Current Medications (including OTC/herbals), Comorbidities and Allergies     Current Outpatient Medications   Medication Sig Dispense Refill   ??? acetaminophen (TYLENOL) 325 MG tablet Take 2 tablets (650 mg total) by mouth every eight (8) hours as needed for pain. 30 tablet 0   ??? aspirin 325 MG tablet Take 325 mg by mouth daily.     ??? cholecalciferol, vitamin D3, 2,000 unit cap Take 1 capsule (2,000 Units total) by mouth daily. 90 capsule 3   ??? entecavir (BARACLUDE) 0.5 MG tablet Take 1 tablet (0.5 mg total) by mouth daily. 90 each 3   ??? fluticasone propion-salmeterol (ADVAIR DISKUS) 100-50 mcg/dose diskus Inhale. Only takes in the winter     ??? hydrOXYzine (ATARAX) 25 MG tablet Take 25 mg by mouth daily.     ??? levalbuterol (XOPENEX HFA) 45 mcg/actuation inhaler Inhale 2 puffs. As needed     ??? levothyroxine (SYNTHROID, LEVOTHROID) 125 MCG tablet Take 125 mcg by mouth daily at 0600.     ??? mycophenolate (MYFORTIC) 180 MG EC tablet Take 2 tablets (360 mg total) by mouth Two (2) times a day. 360 tablet 3   ??? OMEGA-3/DHA/EPA/FISH OIL (FISH OIL-OMEGA-3 FATTY ACIDS) 300-1,000 mg capsule Take 1,200 mg by mouth daily.     ??? omeprazole (PRILOSEC) 20 MG capsule Take 20 mg by mouth daily. 4/10: pt takes omep 20 every other day     ??? phentermine 15 MG capsule Take 15 mg by mouth every morning.     ??? tacrolimus (PROGRAF) 1 MG capsule Take 2 capsules (2 mg total) by mouth two (2) times a day. 360 capsule 3     No current facility-administered medications for this visit.       Allergies   Allergen Reactions   ??? Fosamax [Alendronate]      Itchy throat and rash   ??? Other      Wasp   sting   ??? Furosemide      Other reaction(s): Other (See Comments)  Abdominal pain, fever.  Trouble breathing, severe abdominal pain after 1 week       Patient Active Problem List   Diagnosis   ??? Alpha-1-antitrypsin deficiency (CMS-HCC)   ??? Asthma   ??? GERD (gastroesophageal reflux disease)   ??? Hypothyroid   ??? Liver transplant 01/30/2014       Reviewed and up to date in Epic.    Appropriateness of Therapy     Is medication and dose appropriate based on diagnosis? Yes    Prescription has been clinically reviewed: Yes    Baseline Quality of Life Assessment      How many days over the past month did your transplant  keep you from your normal activities? For example, brushing your teeth or getting up in the morning. 0    Financial Information     Medication Assistance provided: None Required    Anticipated copay of $tac $13 for 30ds, mycophenolate $39 for 90ds, entecavir  $39 for 90ds reviewed with patient. Verified delivery address.    Delivery Information     Scheduled delivery date: na- pt may not fill with [place], will let us know    Expected start date: pt is currently already taking all medications    Medication will be delivered via na to the na address in [place].  This shipment will not require  a signature.      Explained the services we provide at [facility] and that each month we would call to set up refills.  Stressed importance of returning phone calls so that we could ensure they receive their medications in time each month.  Informed patient that we should be setting up refills 7-10 days prior to when they will run out of medication.  A pharmacist will reach out to perform a clinical assessment periodically.  Informed patient that a welcome packet and a drug information handout will be sent.      Patient verbalized understanding of the above information as well as how to contact the pharmacy at [phone] option 4 with any questions/concerns.  The pharmacy is open Monday through Friday 8:30am-4:30pm.  A pharmacist is available 24/7 via pager to answer any clinical questions they may have. Patient Specific Needs     - Does the patient have any physical, cognitive, or cultural barriers? No    - Patient prefers to have medications discussed with  Patient     - Is the patient or caregiver able to read and understand education materials at a high school level or above? Yes    - Patient's primary language is  English     - Is the patient high risk? Yes, patient is taking a REMS drug. Medication is dispensed in compliance with REMS program    - Does the patient require a Care Management Plan? No     - Does the patient require physician intervention or other additional services (i.e. nutrition, smoking cessation, social work)? No      Karen Rogers  [facility] Specialty Pharmacist

## 2020-04-07 MED FILL — ENTECAVIR 0.5 MG TABLET: ORAL | 90 days supply | Qty: 90 | Fill #2

## 2020-04-07 MED FILL — ENTECAVIR 0.5 MG TABLET: 90 days supply | Qty: 90 | Fill #2 | Status: AC

## 2020-04-25 DIAGNOSIS — Z944 Liver transplant status: Principal | ICD-10-CM

## 2020-04-25 DIAGNOSIS — Z79899 Other long term (current) drug therapy: Principal | ICD-10-CM

## 2020-05-06 DIAGNOSIS — Z79899 Other long term (current) drug therapy: Principal | ICD-10-CM

## 2020-05-06 DIAGNOSIS — B181 Chronic viral hepatitis B without delta-agent: Principal | ICD-10-CM

## 2020-05-06 DIAGNOSIS — Z944 Liver transplant status: Principal | ICD-10-CM

## 2020-05-06 MED ORDER — TACROLIMUS 1 MG CAPSULE, IMMEDIATE-RELEASE
ORAL_CAPSULE | Freq: Two times a day (BID) | ORAL | 3 refills | 90 days | Status: CP
Start: 2020-05-06 — End: 2021-05-06

## 2020-05-06 MED ORDER — MYCOPHENOLATE SODIUM 180 MG TABLET,DELAYED RELEASE
ORAL_TABLET | Freq: Two times a day (BID) | ORAL | 3 refills | 90 days | Status: CP
Start: 2020-05-06 — End: ?

## 2020-05-06 MED ORDER — ENTECAVIR 0.5 MG TABLET
ORAL_TABLET | Freq: Every day | ORAL | 3 refills | 90.00000 days | Status: CP
Start: 2020-05-06 — End: 2021-05-06

## 2020-05-23 DIAGNOSIS — Z944 Liver transplant status: Principal | ICD-10-CM

## 2020-05-23 DIAGNOSIS — Z79899 Other long term (current) drug therapy: Principal | ICD-10-CM

## 2020-06-20 DIAGNOSIS — Z944 Liver transplant status: Principal | ICD-10-CM

## 2020-06-20 DIAGNOSIS — Z79899 Other long term (current) drug therapy: Principal | ICD-10-CM

## 2020-07-18 DIAGNOSIS — Z79899 Other long term (current) drug therapy: Principal | ICD-10-CM

## 2020-07-18 DIAGNOSIS — Z944 Liver transplant status: Principal | ICD-10-CM

## 2020-08-15 DIAGNOSIS — Z79899 Other long term (current) drug therapy: Principal | ICD-10-CM

## 2020-08-15 DIAGNOSIS — Z944 Liver transplant status: Principal | ICD-10-CM

## 2020-09-12 DIAGNOSIS — Z79899 Other long term (current) drug therapy: Principal | ICD-10-CM

## 2020-09-12 DIAGNOSIS — Z944 Liver transplant status: Principal | ICD-10-CM

## 2020-11-04 DIAGNOSIS — Z944 Liver transplant status: Principal | ICD-10-CM

## 2020-11-08 MED ORDER — TRELEGY ELLIPTA 100 MCG-62.5 MCG-25 MCG POWDER FOR INHALATION
RESPIRATORY_TRACT | 0 days
Start: 2020-11-08 — End: ?

## 2020-11-09 MED ORDER — ALBUTEROL SULFATE HFA 90 MCG/ACTUATION AEROSOL INHALER
Freq: Four times a day (QID) | RESPIRATORY_TRACT | 0 days | PRN
Start: 2020-11-09 — End: 2021-11-09

## 2021-01-20 MED ORDER — ATORVASTATIN 20 MG TABLET
Freq: Every day | 0 days
Start: 2021-01-20 — End: ?

## 2021-01-23 MED ORDER — METFORMIN ER 500 MG TABLET,EXTENDED RELEASE 24 HR
Freq: Every day | 0 days
Start: 2021-01-23 — End: ?

## 2021-03-20 MED ORDER — ERGOCALCIFEROL (VITAMIN D2) 1,250 MCG (50,000 UNIT) CAPSULE
0 days
Start: 2021-03-20 — End: ?

## 2021-03-27 MED ORDER — FORTEO 20 MCG/DOSE (600 MCG/2.4 ML) SUBCUTANEOUS PEN INJECTOR
0 days
Start: 2021-03-27 — End: ?

## 2021-03-28 DIAGNOSIS — Z79899 Other long term (current) drug therapy: Principal | ICD-10-CM

## 2021-03-28 DIAGNOSIS — Z944 Liver transplant status: Principal | ICD-10-CM

## 2021-03-28 DIAGNOSIS — Z1159 Encounter for screening for other viral diseases: Principal | ICD-10-CM

## 2021-03-29 ENCOUNTER — Ambulatory Visit: Admit: 2021-03-29 | Discharge: 2021-03-29 | Payer: TRICARE (CHAMPUS)

## 2021-03-29 ENCOUNTER — Encounter: Admit: 2021-03-29 | Discharge: 2021-03-29 | Payer: TRICARE (CHAMPUS)

## 2021-03-29 DIAGNOSIS — Z79899 Other long term (current) drug therapy: Principal | ICD-10-CM

## 2021-03-29 DIAGNOSIS — Z944 Liver transplant status: Principal | ICD-10-CM

## 2021-08-16 DIAGNOSIS — Z944 Liver transplant status: Principal | ICD-10-CM

## 2021-08-16 DIAGNOSIS — Z79899 Other long term (current) drug therapy: Principal | ICD-10-CM

## 2021-08-23 DIAGNOSIS — Z79899 Other long term (current) drug therapy: Principal | ICD-10-CM

## 2021-08-23 DIAGNOSIS — Z944 Liver transplant status: Principal | ICD-10-CM

## 2021-08-23 DIAGNOSIS — B181 Chronic viral hepatitis B without delta-agent: Principal | ICD-10-CM

## 2021-08-23 MED ORDER — TACROLIMUS 1 MG CAPSULE, IMMEDIATE-RELEASE
ORAL_CAPSULE | 3 refills | 0 days
Start: 2021-08-23 — End: ?

## 2021-08-23 MED ORDER — MYCOPHENOLATE SODIUM 180 MG TABLET,DELAYED RELEASE
ORAL_TABLET | 3 refills | 0 days
Start: 2021-08-23 — End: ?

## 2021-08-23 MED ORDER — ENTECAVIR 0.5 MG TABLET
ORAL_TABLET | 3 refills | 0 days
Start: 2021-08-23 — End: ?

## 2021-08-24 DIAGNOSIS — Z944 Liver transplant status: Principal | ICD-10-CM

## 2021-08-24 DIAGNOSIS — Z79899 Other long term (current) drug therapy: Principal | ICD-10-CM

## 2021-08-24 DIAGNOSIS — B181 Chronic viral hepatitis B without delta-agent: Principal | ICD-10-CM

## 2021-08-24 MED ORDER — ENTECAVIR 0.5 MG TABLET
ORAL_TABLET | Freq: Every day | ORAL | 3 refills | 0.00000 days | Status: CP
Start: 2021-08-24 — End: 2021-08-24

## 2021-08-24 MED ORDER — TACROLIMUS 1 MG CAPSULE, IMMEDIATE-RELEASE
ORAL_CAPSULE | Freq: Two times a day (BID) | ORAL | 3 refills | 90.00000 days | Status: CP
Start: 2021-08-24 — End: 2022-08-24

## 2021-08-24 MED ORDER — MYCOPHENOLATE SODIUM 180 MG TABLET,DELAYED RELEASE
ORAL_TABLET | Freq: Two times a day (BID) | ORAL | 3 refills | 0.00000 days | Status: CP
Start: 2021-08-24 — End: 2021-08-24

## 2021-09-11 DIAGNOSIS — Z79899 Other long term (current) drug therapy: Principal | ICD-10-CM

## 2021-09-11 DIAGNOSIS — Z944 Liver transplant status: Principal | ICD-10-CM

## 2021-09-13 DIAGNOSIS — Z944 Liver transplant status: Principal | ICD-10-CM

## 2021-09-13 DIAGNOSIS — R7989 Other specified abnormal findings of blood chemistry: Principal | ICD-10-CM

## 2021-09-13 DIAGNOSIS — Z789 Other specified health status: Principal | ICD-10-CM

## 2021-09-13 DIAGNOSIS — B2790 Infectious mononucleosis, unspecified without complication: Principal | ICD-10-CM

## 2021-09-14 ENCOUNTER — Ambulatory Visit: Admit: 2021-09-14 | Discharge: 2021-09-15 | Payer: TRICARE (CHAMPUS)

## 2021-09-14 DIAGNOSIS — I771 Stricture of artery: Principal | ICD-10-CM

## 2021-09-14 DIAGNOSIS — R932 Abnormal findings on diagnostic imaging of liver and biliary tract: Principal | ICD-10-CM

## 2021-09-14 DIAGNOSIS — Z944 Liver transplant status: Principal | ICD-10-CM

## 2021-09-14 DIAGNOSIS — R7989 Other specified abnormal findings of blood chemistry: Principal | ICD-10-CM

## 2021-09-20 ENCOUNTER — Ambulatory Visit: Admit: 2021-09-20 | Discharge: 2021-09-21 | Payer: TRICARE (CHAMPUS)

## 2021-09-21 DIAGNOSIS — Z944 Liver transplant status: Principal | ICD-10-CM

## 2021-09-21 DIAGNOSIS — I771 Stricture of artery: Principal | ICD-10-CM

## 2021-09-21 DIAGNOSIS — R7989 Other specified abnormal findings of blood chemistry: Principal | ICD-10-CM

## 2021-09-22 ENCOUNTER — Ambulatory Visit: Admit: 2021-09-22 | Discharge: 2021-09-23 | Payer: TRICARE (CHAMPUS)

## 2021-09-22 DIAGNOSIS — Z79899 Other long term (current) drug therapy: Principal | ICD-10-CM

## 2021-09-22 DIAGNOSIS — Z944 Liver transplant status: Principal | ICD-10-CM

## 2021-09-22 MED ORDER — TACROLIMUS 1 MG CAPSULE, IMMEDIATE-RELEASE
ORAL_CAPSULE | Freq: Two times a day (BID) | ORAL | 3 refills | 90 days | Status: CP
Start: 2021-09-22 — End: 2022-09-22

## 2021-10-09 DIAGNOSIS — Z944 Liver transplant status: Principal | ICD-10-CM

## 2021-10-09 DIAGNOSIS — Z79899 Other long term (current) drug therapy: Principal | ICD-10-CM

## 2021-11-06 DIAGNOSIS — Z79899 Other long term (current) drug therapy: Principal | ICD-10-CM

## 2021-11-06 DIAGNOSIS — Z944 Liver transplant status: Principal | ICD-10-CM

## 2021-12-04 DIAGNOSIS — Z79899 Other long term (current) drug therapy: Principal | ICD-10-CM

## 2021-12-04 DIAGNOSIS — Z944 Liver transplant status: Principal | ICD-10-CM

## 2022-01-01 DIAGNOSIS — Z79899 Other long term (current) drug therapy: Principal | ICD-10-CM

## 2022-01-01 DIAGNOSIS — Z944 Liver transplant status: Principal | ICD-10-CM

## 2022-01-22 DIAGNOSIS — B181 Chronic viral hepatitis B without delta-agent: Principal | ICD-10-CM

## 2022-01-22 DIAGNOSIS — Z944 Liver transplant status: Principal | ICD-10-CM

## 2022-01-26 ENCOUNTER — Ambulatory Visit: Admit: 2022-01-26 | Discharge: 2022-01-26 | Payer: TRICARE (CHAMPUS)

## 2022-01-26 DIAGNOSIS — B181 Chronic viral hepatitis B without delta-agent: Principal | ICD-10-CM

## 2022-01-26 DIAGNOSIS — Z944 Liver transplant status: Principal | ICD-10-CM

## 2022-01-26 DIAGNOSIS — Z79899 Other long term (current) drug therapy: Principal | ICD-10-CM

## 2022-01-26 DIAGNOSIS — Z23 Encounter for immunization: Principal | ICD-10-CM

## 2022-01-26 MED ORDER — TACROLIMUS 1 MG CAPSULE, IMMEDIATE-RELEASE
ORAL_CAPSULE | Freq: Two times a day (BID) | ORAL | 3 refills | 90 days | Status: CP
Start: 2022-01-26 — End: 2023-01-26

## 2022-01-26 MED ORDER — ENTECAVIR 0.5 MG TABLET
ORAL_TABLET | Freq: Every day | ORAL | 3 refills | 90 days | Status: CP
Start: 2022-01-26 — End: 2023-01-26

## 2022-01-26 MED ORDER — MYCOPHENOLATE SODIUM 180 MG TABLET,DELAYED RELEASE
ORAL_TABLET | Freq: Two times a day (BID) | ORAL | 3 refills | 90 days | Status: CP
Start: 2022-01-26 — End: ?

## 2022-01-29 DIAGNOSIS — Z944 Liver transplant status: Principal | ICD-10-CM

## 2022-01-29 DIAGNOSIS — Z79899 Other long term (current) drug therapy: Principal | ICD-10-CM

## 2022-01-30 DIAGNOSIS — Z79899 Other long term (current) drug therapy: Principal | ICD-10-CM

## 2022-01-30 DIAGNOSIS — Z944 Liver transplant status: Principal | ICD-10-CM

## 2022-01-30 MED ORDER — TACROLIMUS 1 MG CAPSULE, IMMEDIATE-RELEASE
ORAL_CAPSULE | 3 refills | 0 days | Status: CP
Start: 2022-01-30 — End: ?

## 2022-02-06 DIAGNOSIS — R0600 Dyspnea, unspecified: Principal | ICD-10-CM

## 2022-02-26 DIAGNOSIS — Z944 Liver transplant status: Principal | ICD-10-CM

## 2022-02-26 DIAGNOSIS — Z79899 Other long term (current) drug therapy: Principal | ICD-10-CM

## 2022-03-26 DIAGNOSIS — Z79899 Other long term (current) drug therapy: Principal | ICD-10-CM

## 2022-03-26 DIAGNOSIS — Z944 Liver transplant status: Principal | ICD-10-CM

## 2022-04-23 DIAGNOSIS — Z79899 Other long term (current) drug therapy: Principal | ICD-10-CM

## 2022-04-23 DIAGNOSIS — Z944 Liver transplant status: Principal | ICD-10-CM

## 2022-05-02 ENCOUNTER — Ambulatory Visit: Admit: 2022-05-02 | Discharge: 2022-05-02 | Payer: TRICARE (CHAMPUS)

## 2022-05-02 MED ORDER — TIOTROPIUM 2.5 MCG-OLODATEROL 2.5 MCG/ACTUATION MIST FOR INHALATION
Freq: Every day | RESPIRATORY_TRACT | 11 refills | 14 days | Status: CP
Start: 2022-05-02 — End: 2023-05-02

## 2022-05-21 DIAGNOSIS — Z944 Liver transplant status: Principal | ICD-10-CM

## 2022-05-21 DIAGNOSIS — Z79899 Other long term (current) drug therapy: Principal | ICD-10-CM

## 2022-06-18 DIAGNOSIS — Z944 Liver transplant status: Principal | ICD-10-CM

## 2022-06-18 DIAGNOSIS — Z79899 Other long term (current) drug therapy: Principal | ICD-10-CM

## 2022-07-16 DIAGNOSIS — Z79899 Other long term (current) drug therapy: Principal | ICD-10-CM

## 2022-07-16 DIAGNOSIS — Z944 Liver transplant status: Principal | ICD-10-CM

## 2022-08-13 DIAGNOSIS — Z944 Liver transplant status: Principal | ICD-10-CM

## 2022-08-13 DIAGNOSIS — Z79899 Other long term (current) drug therapy: Principal | ICD-10-CM

## 2022-09-10 DIAGNOSIS — Z79899 Other long term (current) drug therapy: Principal | ICD-10-CM

## 2022-09-10 DIAGNOSIS — Z944 Liver transplant status: Principal | ICD-10-CM

## 2022-10-08 DIAGNOSIS — Z79899 Other long term (current) drug therapy: Principal | ICD-10-CM

## 2022-10-08 DIAGNOSIS — Z944 Liver transplant status: Principal | ICD-10-CM

## 2022-11-05 DIAGNOSIS — Z944 Liver transplant status: Principal | ICD-10-CM

## 2022-11-05 DIAGNOSIS — Z79899 Other long term (current) drug therapy: Principal | ICD-10-CM

## 2022-12-03 DIAGNOSIS — Z944 Liver transplant status: Principal | ICD-10-CM

## 2022-12-03 DIAGNOSIS — Z79899 Other long term (current) drug therapy: Principal | ICD-10-CM

## 2023-01-09 DIAGNOSIS — B181 Chronic viral hepatitis B without delta-agent: Principal | ICD-10-CM

## 2023-01-09 DIAGNOSIS — Z944 Liver transplant status: Principal | ICD-10-CM

## 2023-01-09 MED ORDER — ENTECAVIR 0.5 MG TABLET
ORAL_TABLET | Freq: Every day | ORAL | 0 refills | 30.00000 days | Status: CP
Start: 2023-01-09 — End: 2024-01-09

## 2023-01-22 ENCOUNTER — Ambulatory Visit: Admit: 2023-01-22 | Discharge: 2023-01-22 | Payer: TRICARE (CHAMPUS)

## 2023-01-22 DIAGNOSIS — Z944 Liver transplant status: Principal | ICD-10-CM

## 2023-01-22 DIAGNOSIS — Z79899 Other long term (current) drug therapy: Principal | ICD-10-CM

## 2023-05-21 DIAGNOSIS — J431 Panlobular emphysema: Principal | ICD-10-CM

## 2023-05-21 MED ORDER — STIOLTO RESPIMAT 2.5 MCG-2.5 MCG/ACTUATION SOLUTION FOR INHALATION
Freq: Every day | RESPIRATORY_TRACT | 1 refills | 42 days | Status: CP
Start: 2023-05-21 — End: ?

## 2023-06-03 DIAGNOSIS — B181 Chronic viral hepatitis B without delta-agent: Principal | ICD-10-CM

## 2023-06-03 DIAGNOSIS — Z79899 Other long term (current) drug therapy: Principal | ICD-10-CM

## 2023-06-03 DIAGNOSIS — Z944 Liver transplant status: Principal | ICD-10-CM

## 2023-06-03 MED ORDER — ENTECAVIR 0.5 MG TABLET
ORAL_TABLET | Freq: Every day | ORAL | 11 refills | 30.00000 days | Status: CP
Start: 2023-06-03 — End: 2024-06-02

## 2023-06-03 MED ORDER — MYCOPHENOLATE SODIUM 180 MG TABLET,DELAYED RELEASE
ORAL_TABLET | Freq: Two times a day (BID) | ORAL | 3 refills | 90 days | Status: CP
Start: 2023-06-03 — End: 2023-06-03

## 2023-06-03 MED ORDER — TACROLIMUS 1 MG CAPSULE, IMMEDIATE-RELEASE
ORAL_CAPSULE | 3 refills | 0 days | Status: CP
Start: 2023-06-03 — End: 2023-06-03

## 2023-11-04 DIAGNOSIS — Z79899 Other long term (current) drug therapy: Principal | ICD-10-CM

## 2023-11-04 DIAGNOSIS — Z944 Liver transplant status: Principal | ICD-10-CM

## 2023-12-02 DIAGNOSIS — Z944 Liver transplant status: Principal | ICD-10-CM

## 2023-12-02 DIAGNOSIS — Z79899 Other long term (current) drug therapy: Principal | ICD-10-CM

## 2023-12-30 DIAGNOSIS — Z944 Liver transplant status: Principal | ICD-10-CM

## 2023-12-30 DIAGNOSIS — Z79899 Other long term (current) drug therapy: Principal | ICD-10-CM

## 2024-01-27 DIAGNOSIS — Z79899 Other long term (current) drug therapy: Principal | ICD-10-CM

## 2024-01-27 DIAGNOSIS — Z944 Liver transplant status: Principal | ICD-10-CM

## 2024-01-28 DIAGNOSIS — Z944 Liver transplant status: Principal | ICD-10-CM

## 2024-01-28 DIAGNOSIS — Z1159 Encounter for screening for other viral diseases: Principal | ICD-10-CM

## 2024-01-28 DIAGNOSIS — Z79899 Other long term (current) drug therapy: Principal | ICD-10-CM

## 2024-01-29 ENCOUNTER — Ambulatory Visit: Admit: 2024-01-29 | Discharge: 2024-01-29 | Payer: TRICARE (CHAMPUS)

## 2024-01-29 ENCOUNTER — Inpatient Hospital Stay: Admit: 2024-01-29 | Discharge: 2024-01-29 | Payer: TRICARE (CHAMPUS)

## 2024-01-29 DIAGNOSIS — Z79899 Other long term (current) drug therapy: Principal | ICD-10-CM

## 2024-01-29 DIAGNOSIS — B181 Chronic viral hepatitis B without delta-agent: Principal | ICD-10-CM

## 2024-01-29 DIAGNOSIS — Z944 Liver transplant status: Principal | ICD-10-CM

## 2024-01-29 MED ORDER — TACROLIMUS 1 MG CAPSULE, IMMEDIATE-RELEASE
ORAL_CAPSULE | ORAL | 3 refills | 0.00000 days | Status: CP
Start: 2024-01-29 — End: ?

## 2024-01-29 MED ORDER — MYCOPHENOLATE SODIUM 180 MG TABLET,DELAYED RELEASE
ORAL_TABLET | Freq: Two times a day (BID) | ORAL | 3 refills | 90.00000 days | Status: CP
Start: 2024-01-29 — End: ?

## 2024-01-29 MED ORDER — ENTECAVIR 0.5 MG TABLET
ORAL_TABLET | Freq: Every day | ORAL | 3 refills | 90.00000 days | Status: CP
Start: 2024-01-29 — End: 2024-01-29

## 2024-01-31 DIAGNOSIS — Z79899 Other long term (current) drug therapy: Principal | ICD-10-CM

## 2024-01-31 DIAGNOSIS — Z944 Liver transplant status: Principal | ICD-10-CM

## 2024-01-31 MED ORDER — TACROLIMUS 1 MG CAPSULE, IMMEDIATE-RELEASE
ORAL_CAPSULE | Freq: Two times a day (BID) | ORAL | 3 refills | 90.00000 days | Status: CP
Start: 2024-01-31 — End: ?

## 2024-03-09 DIAGNOSIS — Z79899 Other long term (current) drug therapy: Principal | ICD-10-CM

## 2024-03-09 DIAGNOSIS — Z944 Liver transplant status: Principal | ICD-10-CM

## 2024-04-06 DIAGNOSIS — Z944 Liver transplant status: Principal | ICD-10-CM

## 2024-04-06 DIAGNOSIS — Z79899 Other long term (current) drug therapy: Principal | ICD-10-CM

## 2024-05-04 DIAGNOSIS — Z79899 Other long term (current) drug therapy: Principal | ICD-10-CM

## 2024-05-04 DIAGNOSIS — Z944 Liver transplant status: Principal | ICD-10-CM

## 2024-06-01 DIAGNOSIS — Z79899 Other long term (current) drug therapy: Principal | ICD-10-CM

## 2024-06-01 DIAGNOSIS — Z944 Liver transplant status: Principal | ICD-10-CM

## 2024-06-29 DIAGNOSIS — Z944 Liver transplant status: Principal | ICD-10-CM

## 2024-06-29 DIAGNOSIS — Z79899 Other long term (current) drug therapy: Principal | ICD-10-CM

## 2024-07-27 DIAGNOSIS — Z79899 Other long term (current) drug therapy: Principal | ICD-10-CM

## 2024-07-27 DIAGNOSIS — Z944 Liver transplant status: Principal | ICD-10-CM

## 2024-08-24 DIAGNOSIS — Z944 Liver transplant status: Principal | ICD-10-CM

## 2024-08-24 DIAGNOSIS — Z79899 Other long term (current) drug therapy: Principal | ICD-10-CM

## 2024-09-21 DIAGNOSIS — Z944 Liver transplant status: Principal | ICD-10-CM

## 2024-09-21 DIAGNOSIS — Z79899 Other long term (current) drug therapy: Principal | ICD-10-CM
